# Patient Record
Sex: Female | Born: 1970
Health system: Southern US, Community
[De-identification: ages and names within clinical notes are randomized; demographics above are authoritative.]

## PROBLEM LIST (undated history)

## (undated) DIAGNOSIS — M1990 Unspecified osteoarthritis, unspecified site: Secondary | ICD-10-CM

## (undated) DIAGNOSIS — M2460 Ankylosis, unspecified joint: Secondary | ICD-10-CM

## (undated) DIAGNOSIS — IMO0002 Reserved for concepts with insufficient information to code with codable children: Secondary | ICD-10-CM

## (undated) DIAGNOSIS — I10 Essential (primary) hypertension: Secondary | ICD-10-CM

## (undated) DIAGNOSIS — F32A Depression, unspecified: Secondary | ICD-10-CM

## (undated) DIAGNOSIS — M4120 Other idiopathic scoliosis, site unspecified: Secondary | ICD-10-CM

## (undated) DIAGNOSIS — F329 Major depressive disorder, single episode, unspecified: Secondary | ICD-10-CM

## (undated) HISTORY — PX: KNEE SURGERY: SHX244

## (undated) HISTORY — PX: SHOULDER ARTHROSCOPY: SHX128

---

## 1998-10-25 ENCOUNTER — Emergency Department (HOSPITAL_COMMUNITY): Admission: EM | Admit: 1998-10-25 | Discharge: 1998-10-25 | Payer: Self-pay | Admitting: Emergency Medicine

## 1998-10-25 ENCOUNTER — Encounter: Payer: Self-pay | Admitting: Emergency Medicine

## 1999-05-06 ENCOUNTER — Encounter: Payer: Self-pay | Admitting: Emergency Medicine

## 1999-05-06 ENCOUNTER — Emergency Department (HOSPITAL_COMMUNITY): Admission: EM | Admit: 1999-05-06 | Discharge: 1999-05-06 | Payer: Self-pay | Admitting: Emergency Medicine

## 2000-07-27 ENCOUNTER — Encounter: Payer: Self-pay | Admitting: Obstetrics and Gynecology

## 2000-07-27 ENCOUNTER — Ambulatory Visit (HOSPITAL_COMMUNITY): Admission: RE | Admit: 2000-07-27 | Discharge: 2000-07-27 | Payer: Self-pay | Admitting: Obstetrics and Gynecology

## 2000-07-28 ENCOUNTER — Inpatient Hospital Stay (HOSPITAL_COMMUNITY): Admission: AD | Admit: 2000-07-28 | Discharge: 2000-07-30 | Payer: Self-pay | Admitting: Obstetrics and Gynecology

## 2000-10-12 ENCOUNTER — Ambulatory Visit (HOSPITAL_COMMUNITY): Admission: RE | Admit: 2000-10-12 | Discharge: 2000-10-12 | Payer: Self-pay | Admitting: Internal Medicine

## 2000-10-13 ENCOUNTER — Encounter: Payer: Self-pay | Admitting: Internal Medicine

## 2001-01-08 ENCOUNTER — Emergency Department (HOSPITAL_COMMUNITY): Admission: EM | Admit: 2001-01-08 | Discharge: 2001-01-09 | Payer: Self-pay | Admitting: Emergency Medicine

## 2001-07-07 ENCOUNTER — Emergency Department (HOSPITAL_COMMUNITY): Admission: EM | Admit: 2001-07-07 | Discharge: 2001-07-07 | Payer: Self-pay | Admitting: Emergency Medicine

## 2001-07-15 ENCOUNTER — Encounter: Admission: RE | Admit: 2001-07-15 | Discharge: 2001-07-15 | Payer: Self-pay | Admitting: Internal Medicine

## 2001-07-15 ENCOUNTER — Encounter: Payer: Self-pay | Admitting: Internal Medicine

## 2001-11-02 ENCOUNTER — Other Ambulatory Visit: Admission: RE | Admit: 2001-11-02 | Discharge: 2001-11-02 | Payer: Self-pay | Admitting: Obstetrics and Gynecology

## 2001-11-11 ENCOUNTER — Ambulatory Visit (HOSPITAL_COMMUNITY): Admission: RE | Admit: 2001-11-11 | Discharge: 2001-11-11 | Payer: Self-pay | Admitting: *Deleted

## 2001-11-11 ENCOUNTER — Encounter (INDEPENDENT_AMBULATORY_CARE_PROVIDER_SITE_OTHER): Payer: Self-pay | Admitting: Specialist

## 2002-01-27 ENCOUNTER — Emergency Department (HOSPITAL_COMMUNITY): Admission: EM | Admit: 2002-01-27 | Discharge: 2002-01-27 | Payer: Self-pay | Admitting: Emergency Medicine

## 2002-08-01 ENCOUNTER — Encounter: Payer: Self-pay | Admitting: Internal Medicine

## 2002-08-01 ENCOUNTER — Encounter: Admission: RE | Admit: 2002-08-01 | Discharge: 2002-08-01 | Payer: Self-pay | Admitting: Internal Medicine

## 2003-01-03 ENCOUNTER — Encounter: Admission: RE | Admit: 2003-01-03 | Discharge: 2003-02-14 | Payer: Self-pay | Admitting: Orthopedic Surgery

## 2003-02-10 ENCOUNTER — Encounter: Admission: RE | Admit: 2003-02-10 | Discharge: 2003-02-10 | Payer: Self-pay | Admitting: Orthopedic Surgery

## 2003-07-13 ENCOUNTER — Encounter: Admission: RE | Admit: 2003-07-13 | Discharge: 2003-07-13 | Payer: Self-pay | Admitting: Internal Medicine

## 2003-11-06 ENCOUNTER — Encounter: Admission: RE | Admit: 2003-11-06 | Discharge: 2003-11-06 | Payer: Self-pay | Admitting: Psychiatry

## 2004-02-01 ENCOUNTER — Ambulatory Visit (HOSPITAL_COMMUNITY): Admission: RE | Admit: 2004-02-01 | Discharge: 2004-02-01 | Payer: Self-pay | Admitting: Family Medicine

## 2004-03-18 ENCOUNTER — Encounter: Admission: RE | Admit: 2004-03-18 | Discharge: 2004-05-08 | Payer: Self-pay | Admitting: Specialist

## 2004-06-18 ENCOUNTER — Encounter: Admission: RE | Admit: 2004-06-18 | Discharge: 2004-06-18 | Payer: Self-pay | Admitting: Specialist

## 2005-02-18 ENCOUNTER — Inpatient Hospital Stay (HOSPITAL_COMMUNITY): Admission: AD | Admit: 2005-02-18 | Discharge: 2005-02-20 | Payer: Self-pay | Admitting: Obstetrics and Gynecology

## 2005-04-07 ENCOUNTER — Emergency Department (HOSPITAL_COMMUNITY): Admission: EM | Admit: 2005-04-07 | Discharge: 2005-04-07 | Payer: Self-pay | Admitting: Family Medicine

## 2005-06-02 ENCOUNTER — Emergency Department (HOSPITAL_COMMUNITY): Admission: EM | Admit: 2005-06-02 | Discharge: 2005-06-02 | Payer: Self-pay | Admitting: Family Medicine

## 2005-06-19 ENCOUNTER — Emergency Department (HOSPITAL_COMMUNITY): Admission: EM | Admit: 2005-06-19 | Discharge: 2005-06-19 | Payer: Self-pay | Admitting: Family Medicine

## 2005-07-12 ENCOUNTER — Emergency Department (HOSPITAL_COMMUNITY): Admission: EM | Admit: 2005-07-12 | Discharge: 2005-07-12 | Payer: Self-pay | Admitting: Emergency Medicine

## 2006-01-23 ENCOUNTER — Emergency Department (HOSPITAL_COMMUNITY): Admission: EM | Admit: 2006-01-23 | Discharge: 2006-01-24 | Payer: Self-pay | Admitting: *Deleted

## 2006-04-26 ENCOUNTER — Emergency Department (HOSPITAL_COMMUNITY): Admission: EM | Admit: 2006-04-26 | Discharge: 2006-04-26 | Payer: Self-pay | Admitting: Emergency Medicine

## 2006-05-07 ENCOUNTER — Emergency Department (HOSPITAL_COMMUNITY): Admission: EM | Admit: 2006-05-07 | Discharge: 2006-05-08 | Payer: Self-pay | Admitting: Emergency Medicine

## 2006-06-12 ENCOUNTER — Emergency Department (HOSPITAL_COMMUNITY): Admission: EM | Admit: 2006-06-12 | Discharge: 2006-06-12 | Payer: Self-pay | Admitting: Emergency Medicine

## 2008-06-24 ENCOUNTER — Encounter: Admission: RE | Admit: 2008-06-24 | Discharge: 2008-06-24 | Payer: Self-pay | Admitting: Orthopedic Surgery

## 2008-08-22 ENCOUNTER — Ambulatory Visit (HOSPITAL_COMMUNITY): Admission: RE | Admit: 2008-08-22 | Discharge: 2008-08-22 | Payer: Self-pay | Admitting: Orthopedic Surgery

## 2008-10-07 ENCOUNTER — Emergency Department (HOSPITAL_COMMUNITY): Admission: EM | Admit: 2008-10-07 | Discharge: 2008-10-07 | Payer: Self-pay | Admitting: Emergency Medicine

## 2009-03-12 ENCOUNTER — Observation Stay (HOSPITAL_COMMUNITY): Admission: AD | Admit: 2009-03-12 | Discharge: 2009-03-13 | Payer: Self-pay | Admitting: Orthopedic Surgery

## 2010-04-26 ENCOUNTER — Encounter: Payer: Self-pay | Admitting: Specialist

## 2010-04-27 ENCOUNTER — Encounter: Payer: Self-pay | Admitting: Orthopedic Surgery

## 2010-04-27 ENCOUNTER — Encounter: Payer: Self-pay | Admitting: Specialist

## 2010-04-29 ENCOUNTER — Emergency Department (HOSPITAL_COMMUNITY)
Admission: EM | Admit: 2010-04-29 | Discharge: 2010-04-29 | Payer: Self-pay | Source: Home / Self Care | Admitting: Emergency Medicine

## 2010-07-01 ENCOUNTER — Inpatient Hospital Stay (INDEPENDENT_AMBULATORY_CARE_PROVIDER_SITE_OTHER)
Admission: RE | Admit: 2010-07-01 | Discharge: 2010-07-01 | Disposition: A | Discharge status: Home / Self Care | Payer: 59 | Source: Ambulatory Visit | Attending: Family Medicine | Admitting: Family Medicine

## 2010-07-01 DIAGNOSIS — J020 Streptococcal pharyngitis: Secondary | ICD-10-CM

## 2010-07-08 LAB — URINALYSIS, ROUTINE W REFLEX MICROSCOPIC
Glucose, UA: NEGATIVE mg/dL
Hgb urine dipstick: NEGATIVE
Specific Gravity, Urine: 1.018 (ref 1.005–1.030)

## 2010-07-08 LAB — COMPREHENSIVE METABOLIC PANEL
BUN: 6 mg/dL (ref 6–23)
CO2: 29 mEq/L (ref 19–32)
Chloride: 99 mEq/L (ref 96–112)
Creatinine, Ser: 0.67 mg/dL (ref 0.4–1.2)
GFR calc non Af Amer: >60 mL/min (ref >60)
Total Bilirubin: 0.6 mg/dL (ref 0.3–1.2)

## 2010-07-08 LAB — CBC
HCT: 41.4 % (ref 36.0–46.0)
MCV: 93.9 fL (ref 78.0–100.0)
RBC: 4.41 MIL/uL (ref 3.87–5.11)
WBC: 7.3 10*3/uL (ref 4.0–10.5)

## 2010-07-14 LAB — URINALYSIS, ROUTINE W REFLEX MICROSCOPIC
Ketones, ur: NEGATIVE mg/dL
Nitrite: NEGATIVE
Protein, ur: NEGATIVE mg/dL
Urobilinogen, UA: 0.2 mg/dL (ref 0.0–1.0)
pH: 6.5 (ref 5.0–8.0)

## 2010-07-14 LAB — RAPID URINE DRUG SCREEN, HOSP PERFORMED: Tetrahydrocannabinol: NOT DETECTED

## 2010-07-14 LAB — DIFFERENTIAL
Basophils Absolute: 0.1 10*3/uL (ref 0.0–0.1)
Lymphocytes Relative: 32 % (ref 12–46)
Monocytes Absolute: 0.6 10*3/uL (ref 0.1–1.0)
Monocytes Relative: 6 % (ref 3–12)
Neutro Abs: 5.9 10*3/uL (ref 1.7–7.7)

## 2010-07-14 LAB — POCT I-STAT, CHEM 8
BUN: 12 mg/dL (ref 6–23)
Calcium, Ion: 1.16 mmol/L (ref 1.12–1.32)
Chloride: 103 mEq/L (ref 96–112)
Potassium: 3.6 mEq/L (ref 3.5–5.1)

## 2010-07-14 LAB — CBC
Hemoglobin: 11.8 g/dL — ABNORMAL LOW (ref 12.0–15.0)
RBC: 3.66 MIL/uL — ABNORMAL LOW (ref 3.87–5.11)
RDW: 15.2 % (ref 11.5–15.5)

## 2010-07-14 LAB — POCT CARDIAC MARKERS
CKMB, poc: <1 ng/mL — ABNORMAL LOW (ref 1.0–8.0)
Myoglobin, poc: 32.2 ng/mL (ref 12–200)
Troponin i, poc: <0.05 ng/mL (ref 0.00–0.09)

## 2010-07-14 LAB — PREGNANCY, URINE: Preg Test, Ur: NEGATIVE

## 2010-07-15 LAB — BASIC METABOLIC PANEL
Calcium: 9.4 mg/dL (ref 8.4–10.5)
GFR calc non Af Amer: >60 mL/min (ref >60)
Potassium: 3.5 mEq/L (ref 3.5–5.1)
Sodium: 138 mEq/L (ref 135–145)

## 2010-07-15 LAB — URINALYSIS, ROUTINE W REFLEX MICROSCOPIC
Nitrite: NEGATIVE
Specific Gravity, Urine: 1.023 (ref 1.005–1.030)
Urobilinogen, UA: 0.2 mg/dL (ref 0.0–1.0)
pH: 6 (ref 5.0–8.0)

## 2010-07-15 LAB — CBC
HCT: 39.9 % (ref 36.0–46.0)
Hemoglobin: 13.4 g/dL (ref 12.0–15.0)
RBC: 4.23 MIL/uL (ref 3.87–5.11)
WBC: 8.4 10*3/uL (ref 4.0–10.5)

## 2010-08-19 NOTE — H&P (Signed)
NAMESHONETTE, RHAMES NO.:  [id number]   MEDICAL RECORD NO.:  [id number]          PATIENT TYPE:  EMS   LOCATION:  MAJO                         FACILITY:  [facility]   PHYSICIAN:  Herbie Saxon, MDDATE OF BIRTH:  January 03, 1971   DATE OF ADMISSION:  10/07/2008  DATE OF DISCHARGE:  10/07/2008                              HISTORY & PHYSICAL   PRIMARY CARE PHYSICIAN:  Thayer Headings, MD   CODE STATUS:  Full code.   PRESENTING COMPLAINT:  Chest pain, 2 hours duration.   HISTORY OF PRESENTING COMPLAINT:  This 40 year old African American  female was quite well until 7 p.m. yesterday night when she suddenly  noticed substernal chest pain, 6-8/10 in intensity, squeezing in nature  radiating to the back and to the left arm.  The patient noticed some  shortness of breath.  She denies any diaphoresis or syncopal episode.  She denies any cough or hemoptysis.  The patient has not been  experiencing paroxysmal nocturnal dyspnea, orthopnea, or pedal edema.  The patient smokes less than half a pack a day for more than 18 years.  She used to drink alcohol in college and she has a remote history of  smoking marijuana in college.  The patient does have a significant  family history of coronary artery disease in father, sister, and  brother.  Per the patient, she was diagnosed with a mild myocardial  infarction in 2000 at [facility].  No history of stress test,  echocardiogram, or cardiac cath being performed then.  The patient  denies any history referable to the gastrointestinal, genitourinary,  respiratory, musculoskeletal, or neurological system.   PAST MEDICAL HISTORY:  1. Hyperlipidemia.  2. Depression.  3. Hypertension.  4. Questionable mild MI in 2000.   PAST SURGERIES:  Left knee surgery about 4 months ago.   __________  The patient was recently admitted overnight at [facility] for a bout of depression.  Currently, she does not have any  suicidal ideations.   Please note that this chest pain started 2 hours  after the patient had had an argument with her husband.  She was cooking  where the chest pain started.  The patient claims chest pain is partly  relieved by rest.   PAST SURGICAL HISTORY:  D&C in 2004, the left knee surgery 4 months ago.   FAMILY HISTORY:  Father deceased of coronary artery disease, diabetes.  Brother has heart disease.  He is status post pacemaker.  Sister also  has heart disease.  Grandmother ovarian cancer.   SOCIAL HISTORY:  She is married.  She smokes less than a for pack a day.  She denies any history of illicit drug abuse and currently does not  drink alcohol.   MEDICATIONS AT HOME:  1. Lisinopril 10 mg daily.  2. Naproxen 750 mg daily.  3. Vicodin 10 mg q.6 h. p.r.n.  4. Vitamin B1 one tablet daily.  5. Vitamin C 500 mg daily.  6. Multivitamin 1 tablet daily.  7. Lexapro 10 mg daily.   ALLERGIES:  AMOXICILLIN.   REVIEW OF SYSTEMS:  Fourteen-point review of system was done with the  patient.  Pertinent positives  as stated in the history of presenting  complaint.   PHYSICAL EXAMINATION:  GENERAL:  She is a young lady not in acute  distress.  VITAL SIGNS:  Temperature 98, pulse 75, respiratory rate is 20, blood  pressure 132/81.  HEENT:  Pupils are equal, reactive to light and accommodation.  Head is  atraumatic, normocephalic.  Mucous membranes are moist.  Tympanic  membranes intact.  Oropharynx and nasopharynx are clear.  NECK:  Supple.  No elevated JVD.  No thyromegaly.  No carotid bruit, no  lymphadenopathy.  CHEST:  Clinically clear.  No chest wall tenderness.  HEART:  Heart sounds 1 and 2.  Regular rate and rhythm.  No murmur,  rubs, gallops, or clicks.  ABDOMEN:  Soft, nontender.  Bowel sounds present.  No ventral or  inguinal hernia.  CNS:  No focal neurologic deficits.  EXTREMITIES:  No pedal edema.  No calf tenderness.  No clubbing or  cyanosis.  SKIN:  No rashes.  MUSCULOSKELETAL:   Normal range of movement.   LABORATORY DATA:  WBC 10, hematocrit 34, platelet count 328, troponin  0.05.  Chest x-ray, no acute changes.  Urine toxicology positive for  opiates.  Chemistry, sodium 137, potassium 3.6, chloride 103,  bicarbonate 28, BUN 12, creatinine 0.8, and glucose is 77.   ASSESSMENT:  Chest pain, rule out acute coronary syndrome, mild anemia,  history of depression, history of hyperlipidemia not on medications,  hypertension is stable, family history of coronary artery disease,  tobacco abuse.   PLAN:  The patient is to be admitted for 23-hour observation, telemetry  for initial 23 hours.  We will cycle her cardiac enzymes and EKG q.8 h.  x3.  We will obtain a fasting lipid panel, thyroid function test,  homocysteine coagulation parameters, schedule her for 2-D  echocardiogram.  We will obtain a 2 D-dimer to start and proceed with CT  angiogram if D-dimer elevated.   DIET:  2-g sodium, low cholesterol.   ACTIVITY:  Bedrest.  IV Hep-Lock.  Oxygen 2-4 L via nasal cannula p.r.n.  to keep sats greater than 90, heparin 5000 units subcu q.8 h. for DVT  prophylaxis, Protonix 40 mg IV daily.  We will hold the naproxen, DuoNeb  we are going to dose q.6 h. p.r.n.   We will continue with her home medications.  Decrease her dose of  lisinopril to 5 mg daily.  Start metoprolol 12.5 mg b.i.d., hold if  heart rate is less than 60 or blood pressure less than 160.  Counseled  on tobacco cessation.  We will start on nicotine patch 7 mg per day,  also add Xanax 0.25 mg b.i.d., n.p.o. from midnight for possible stress  test a.m. if cardiac enzymes positive, chest pain persists.  Nitropaste  half-inch q.6 hourly, hold between 6 a.m. and noon daily.   Behavioral health input will be sought.  The patient's illness,  medication test, and treatment plan explained to her, she verbalizes  understanding.  Encounter time 1 hour.      Herbie Saxon, MD  Electronically  Signed     MIO/MEDQ  D:  10/07/2008  T:  10/07/2008  Job:  [id number]   cc:   Lorelle Formosa, M.D.

## 2010-08-19 NOTE — Op Note (Signed)
NAMELEI, DOWER NO.:  [id number]   MEDICAL RECORD NO.:  [id number]          PATIENT TYPE:  AMB   LOCATION:  SDS                          FACILITY:  [facility]   PHYSICIAN:  Myrtie Neither, MD      DATE OF BIRTH:  31-Mar-1971   DATE OF PROCEDURE:  08/22/2008  DATE OF DISCHARGE:                               OPERATIVE REPORT   PREOPERATIVE DIAGNOSIS:  Internal derangement attenuated anterior  cruciate ligament injury left knee.   POSTOPERATIVE DIAGNOSES:  1. Partial tear left anterior cruciate ligament.  2. Synovial cyst.  3. Lateral meniscal tear.   ANESTHESIA:  General.   PROCEDURES:  1. Arthroscopic synovectomy and excision of synovial cyst.  2. Partial lateral meniscectomy left knee.   DESCRIPTION OF PROCEDURE:  The patient was taken to the operating room  after given adequate preop medications given general anesthesia and  intubated.  Right lower extremity was prepped with DuraPrep and draped  in a sterile manner.  Tourniquet using hemostasis.  One half inch  puncture wound was made along the anterior, medial and lateral joint  line.  Inflow was through the medial suprapatellar pouch area.  Inspection of the joint revealed partial disruption of the ACL along  this anterior medial foot with synovial cyst at the base of it and old  fibrotic hemorrhagic areas.  Tension was applied to the ACL which  revealed that there was some attenuation but 90% of the ACL was still  intact and stable.  There was a radial tear of the lateral meniscus.  With the use of basket forceps this was resected back to good healthy  meniscal tissue.  Partial synovectomy also was done both medial and  lateral compartments.  The medial meniscus was well-preserved.  Articular surface was well-preserved.  There was some arthritic changes  involving the lateral compartment of the tibial surface.  No loose  bodies were identified.  Wound closure was then done with 4-0 nylon, 15  mL of  0.25% Marcaine with epinephrine was injected.  Compressive  dressing was applied.  The patient tolerated the procedure quite well  and went to recovery room in stable and satisfactory condition.  The  patient will be discharged home on Percocet 1-2 q. 6 p.r.n., ice packs,  partial weightbearing with use of crutches on the left side and return  to the office in 1 week.  The patient is being discharged in stable and  satisfactory condition.     Myrtie Neither, MD  Electronically Signed     Myrtie Neither, MD  Electronically Signed   AC/MEDQ  D:  08/22/2008  T:  08/22/2008  Job:  [id number]

## 2010-08-22 NOTE — Op Note (Signed)
   NAME:  Kelsey Hines, Kelsey Hines                     ACCOUNT NO.:  [id number]   MEDICAL RECORD NO.:  [id number]                   PATIENT TYPE:  AMB   LOCATION:  SDC                                  [facility]  [facility]   PHYSICIAN:  [place] B. Earlene Plater, M.D.               DATE OF BIRTH:  July 19, 1970   DATE OF PROCEDURE:  DATE OF DISCHARGE:                                 OPERATIVE REPORT   PREOPERATIVE DIAGNOSIS:  A 6 week intrauterine pregnancy desires elective  termination.   POSTOPERATIVE DIAGNOSIS:  A 6 week intrauterine pregnancy desires elective  termination.   PROCEDURE:  Suction and curettage.   ANESTHESIA:  __________ 20 cc 1% lidocaine pericervical block.   SURGEON:  Chester Holstein. Earlene Plater, M.D.   SPECIMENS:  Uterine contents.   FINDINGS:  An 8 week sized inverted uterus.   COMPLICATIONS:  None.   INDICATIONS FOR PROCEDURE:  The patient desires elective termination of  pregnancy.   DESCRIPTION OF PROCEDURE:  The patient was taken to the operating room and  adequate anesthesia was obtained.  She was placed in the ski position, and  prepped and draped in standard fashion.  The bladder was entered into with a  red rubber catheter.  Speculum was inserted, and the anterior lip of the  cervix was grasped with a tooth tenaculum, and pericervical block placed in  standard technique.   The cervix was noted to be dilated already to about a #15.  Dilation was  continued to a #23 without difficulty.   A #7 suction cannula was inserted, suctioned obtained, products returned.  Three to four more patches until no tissues returned.  The uterus was  curetted until a gray texture was noted throughout.  Suction was passed once  more, no further tissue returned.  Therefore, the procedure was terminated.   Instruments were removed.  Her cervix was hemostatic.  The patient was taken  to the recovery room awake and in stable condition.                                                 Gerri Spore B.  Earlene Plater, M.D.    WBD/MEDQ  D:  11/11/2001  T:  11/12/2001  Job:  [id number]

## 2010-08-22 NOTE — H&P (Signed)
   NAME:  Kelsey Hines, ARPS                     ACCOUNT NO.:  [id number]   MEDICAL RECORD NO.:  [id number]                   PATIENT TYPE:  AMB   LOCATION:  SDC                                  [facility]   PHYSICIAN:  [place] B. Earlene Plater, M.D.               DATE OF BIRTH:  1971/01/17   DATE OF ADMISSION:  11/11/2001  DATE OF DISCHARGE:                                HISTORY & PHYSICAL   INTENDED PROCEDURE:  Suction curettage for elective abortion.   HISTORY OF PRESENT ILLNESS:  A 40 year old African-American female gravida  3, para 1, A1, LMP September 18, 2001 desires elective termination.  Ultrasound  shows an intrauterine gestational sac and yolk sac, although fetal pole  could not be identified on November 02, 2001.   PAST MEDICAL HISTORY:  Migraine, history of PID.   PAST SURGICAL HISTORY:  None.   MEDICATIONS:  Nortriptyline.   ALLERGIES:  None.   SOCIAL HISTORY:  One-quarter pack a day smoker.  No alcohol or drugs.   FAMILY HISTORY:  Noncontributory.   REVIEW OF SYMPTOMS:  Otherwise negative.   PHYSICAL EXAMINATION:  VITAL SIGNS:  Blood pressure 104/76, weight 218.  GENERAL:  Alert, oriented, in no acute distress.  SKIN:  Warm with no lesions.  NECK:  Supple.  No thyromegaly.  HEART:  Regular rate and rhythm.  LUNGS:  Clear to auscultation.  ABDOMEN:  Obese.  Liver and spleen normal, no hernia.  LYMPH NODE SURVEY:  Negative with neck, axilla, and groin.  PELVIC:  Normal external genitalia.  Vagina, cervix normal.  Uterus is about  8 week size.  No adnexal masses.  Cervix is closed.  No bleeding.   ASSESSMENT:  Approximately 5-6 week intrauterine pregnancy, desires  termination.  Operative risks discussed including infection, bleeding,  uterine perforation, damage to bowel, bladder, or surrounding organs.  All  questions answered and patient wished to proceed.  Will check blood type on  day of surgery and __________ if negative.            Gerri Spore B. Earlene Plater, M.D.    WBD/MEDQ  D:  11/09/2001  T:  11/10/2001  Job:  [id number]
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
## 2010-11-24 ENCOUNTER — Emergency Department (HOSPITAL_COMMUNITY)
Admission: EM | Admit: 2010-11-24 | Discharge: 2010-11-24 | Discharge status: Against Medical Advice | Payer: Self-pay | Attending: Emergency Medicine | Admitting: Emergency Medicine

## 2010-11-24 DIAGNOSIS — M25569 Pain in unspecified knee: Secondary | ICD-10-CM | POA: Insufficient documentation

## 2010-11-24 DIAGNOSIS — M549 Dorsalgia, unspecified: Secondary | ICD-10-CM | POA: Insufficient documentation

## 2013-10-16 ENCOUNTER — Encounter (HOSPITAL_BASED_OUTPATIENT_CLINIC_OR_DEPARTMENT_OTHER): Payer: Self-pay | Admitting: Emergency Medicine

## 2013-10-16 ENCOUNTER — Emergency Department (HOSPITAL_BASED_OUTPATIENT_CLINIC_OR_DEPARTMENT_OTHER): Payer: BC Managed Care – PPO

## 2013-10-16 ENCOUNTER — Emergency Department (HOSPITAL_BASED_OUTPATIENT_CLINIC_OR_DEPARTMENT_OTHER)
Admission: EM | Admit: 2013-10-16 | Discharge: 2013-10-16 | Disposition: A | Discharge status: Home / Self Care | Payer: BC Managed Care – PPO | Attending: Emergency Medicine | Admitting: Emergency Medicine

## 2013-10-16 DIAGNOSIS — Z79899 Other long term (current) drug therapy: Secondary | ICD-10-CM | POA: Insufficient documentation

## 2013-10-16 DIAGNOSIS — M129 Arthropathy, unspecified: Secondary | ICD-10-CM | POA: Insufficient documentation

## 2013-10-16 DIAGNOSIS — F3289 Other specified depressive episodes: Secondary | ICD-10-CM | POA: Insufficient documentation

## 2013-10-16 DIAGNOSIS — S8010XA Contusion of unspecified lower leg, initial encounter: Secondary | ICD-10-CM | POA: Insufficient documentation

## 2013-10-16 DIAGNOSIS — F329 Major depressive disorder, single episode, unspecified: Secondary | ICD-10-CM | POA: Insufficient documentation

## 2013-10-16 DIAGNOSIS — IMO0002 Reserved for concepts with insufficient information to code with codable children: Secondary | ICD-10-CM | POA: Insufficient documentation

## 2013-10-16 DIAGNOSIS — S40021A Contusion of right upper arm, initial encounter: Secondary | ICD-10-CM

## 2013-10-16 DIAGNOSIS — S40029A Contusion of unspecified upper arm, initial encounter: Secondary | ICD-10-CM | POA: Insufficient documentation

## 2013-10-16 DIAGNOSIS — I10 Essential (primary) hypertension: Secondary | ICD-10-CM | POA: Insufficient documentation

## 2013-10-16 DIAGNOSIS — F17200 Nicotine dependence, unspecified, uncomplicated: Secondary | ICD-10-CM | POA: Insufficient documentation

## 2013-10-16 HISTORY — DX: Ankylosis, unspecified joint: M24.60

## 2013-10-16 HISTORY — DX: Other idiopathic scoliosis, site unspecified: M41.20

## 2013-10-16 HISTORY — DX: Reserved for concepts with insufficient information to code with codable children: IMO0002

## 2013-10-16 HISTORY — DX: Unspecified osteoarthritis, unspecified site: M19.90

## 2013-10-16 HISTORY — DX: Major depressive disorder, single episode, unspecified: F32.9

## 2013-10-16 HISTORY — DX: Essential (primary) hypertension: I10

## 2013-10-16 HISTORY — DX: Depression, unspecified: F32.A

## 2013-10-16 MED ORDER — TETANUS-DIPHTH-ACELL PERTUSSIS 5-2.5-18.5 LF-MCG/0.5 IM SUSP
0.5000 mL | Freq: Once | INTRAMUSCULAR | Status: AC
  Administered 2013-10-16: 0.5 mL via INTRAMUSCULAR

## 2013-10-16 MED ORDER — HYDROCODONE-ACETAMINOPHEN 5-325 MG PO TABS: 1.0000 | ORAL_TABLET | Freq: Four times a day (QID) | ORAL | Status: DC | PRN

## 2013-10-16 MED ORDER — TETANUS-DIPHTH-ACELL PERTUSSIS 5-2.5-18.5 LF-MCG/0.5 IM SUSP
INTRAMUSCULAR | Status: AC
  Filled 2013-10-16: qty 0.5
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
## 2013-10-16 NOTE — ED Notes (Signed)
NP at bedside.
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
## 2013-10-16 NOTE — Discharge Instructions (Signed)
Contusion °A contusion is a deep bruise. Contusions are the result of an injury that caused bleeding under the skin. The contusion may turn blue, purple, or yellow. Minor injuries will give you a painless contusion, but more severe contusions may stay painful and swollen for a few weeks.  °CAUSES  °A contusion is usually caused by a blow, trauma, or direct force to an area of the body. °SYMPTOMS  °· Swelling and redness of the injured area. °· Bruising of the injured area. °· Tenderness and soreness of the injured area. °· Pain. °DIAGNOSIS  °The diagnosis can be made by taking a history and physical exam. An X-ray, CT scan, or MRI may be needed to determine if there were any associated injuries, such as fractures. °TREATMENT  °Specific treatment will depend on what area of the body was injured. In general, the best treatment for a contusion is resting, icing, elevating, and applying cold compresses to the injured area. Over-the-counter medicines may also be recommended for pain control. Ask your caregiver what the best treatment is for your contusion. °HOME CARE INSTRUCTIONS  °· Put ice on the injured area. °· Put ice in a plastic bag. °· Place a towel between your skin and the bag. °· Leave the ice on for 15-20 minutes, 3-4 times a day, or as directed by your health care provider. °· Only take over-the-counter or prescription medicines for pain, discomfort, or fever as directed by your caregiver. Your caregiver may recommend avoiding anti-inflammatory medicines (aspirin, ibuprofen, and naproxen) for 48 hours because these medicines may increase bruising. °· Rest the injured area. °· If possible, elevate the injured area to reduce swelling. °SEEK IMMEDIATE MEDICAL CARE IF:  °· You have increased bruising or swelling. °· You have pain that is getting worse. °· Your swelling or pain is not relieved with medicines. °MAKE SURE YOU:  °· Understand these instructions. °· Will watch your condition. °· Will get help right  away if you are not doing well or get worse. °Document Released: 12/31/2004 Document Revised: 03/28/2013 Document Reviewed: 01/26/2011 °ExitCare® Patient Information ©2015 [employer]. This information is not intended to replace advice given to you by your health care provider. Make sure you discuss any questions you have with your health care provider. ° °Assault, General °Assault includes any behavior, whether intentional or reckless, which results in bodily injury to another person and/or damage to property. Included in this would be any behavior, intentional or reckless, that by its nature would be understood (interpreted) by a reasonable person as intent to harm another person or to damage his/her property. Threats may be oral or written. They may be communicated through regular mail, computer, fax, or phone. These threats may be direct or implied. °FORMS OF ASSAULT INCLUDE: °· Physically assaulting a person. This includes physical threats to inflict physical harm as well as: °¨ Slapping. °¨ Hitting. °¨ Poking. °¨ Kicking. °¨ Punching. °¨ Pushing. °· Arson. °· Sabotage. °· Equipment vandalism. °· Damaging or destroying property. °· Throwing or hitting objects. °· Displaying a weapon or an object that appears to be a weapon in a threatening manner. °¨ Carrying a firearm of any kind. °¨ Using a weapon to harm someone. °· Using greater physical size/strength to intimidate another. °¨ Making intimidating or threatening gestures. °¨ Bullying. °¨ Hazing. °· Intimidating, threatening, hostile, or abusive language directed toward another person. °¨ It communicates the intention to engage in violence against that person. And it leads a reasonable person to expect that violent behavior   may occur. °· Stalking another person. °IF IT HAPPENS AGAIN: °· Immediately call for emergency help (911 in [place] °· If someone poses clear and immediate danger to you, seek legal authorities to have a protective or restraining order  put in place. °· Less threatening assaults can at least be reported to authorities. °STEPS TO TAKE IF A SEXUAL ASSAULT HAS HAPPENED °· Go to an area of safety. This may include a shelter or staying with a friend. Stay away from the area where you have been attacked. A large percentage of sexual assaults are caused by a friend, relative or associate. °· If medications were given by your caregiver, take them as directed for the full length of time prescribed. °· Only take over-the-counter or prescription medicines for pain, discomfort, or fever as directed by your caregiver. °· If you have come in contact with a sexual disease, find out if you are to be tested again. If your caregiver is concerned about the HIV/AIDS virus, he/she may require you to have continued testing for several months. °· For the protection of your privacy, test results can not be given over the phone. Make sure you receive the results of your test. If your test results are not back during your visit, make an appointment with your caregiver to find out the results. Do not assume everything is normal if you have not heard from your caregiver or the medical facility. It is important for you to follow up on all of your test results. °· File appropriate papers with authorities. This is important in all assaults, even if it has occurred in a family or by a friend. °SEEK MEDICAL CARE IF: °· You have new problems because of your injuries. °· You have problems that may be because of the medicine you are taking, such as: °¨ Rash. °¨ Itching. °¨ Swelling. °¨ Trouble breathing. °· You develop belly (abdominal) pain, feel sick to your stomach (nausea) or are vomiting. °· You begin to run a temperature. °· You need supportive care or referral to a rape crisis center. These are centers with trained personnel who can help you get through this ordeal. °SEEK IMMEDIATE MEDICAL CARE IF: °· You are afraid of being threatened, beaten, or abused. In [place] call  911. °· You receive new injuries related to abuse. °· You develop severe pain in any area injured in the assault or have any change in your condition that concerns you. °· You faint or lose consciousness. °· You develop chest pain or shortness of breath. °Document Released: 03/23/2005 Document Revised: 06/15/2011 Document Reviewed: 11/09/2007 °ExitCare® Patient Information ©2015 [employer]. This information is not intended to replace advice given to you by your health care provider. Make sure you discuss any questions you have with your health care provider. ° °

## 2014-05-28 ENCOUNTER — Encounter (HOSPITAL_COMMUNITY): Payer: Self-pay | Admitting: Emergency Medicine

## 2014-05-28 ENCOUNTER — Emergency Department (INDEPENDENT_AMBULATORY_CARE_PROVIDER_SITE_OTHER)
Admission: EM | Admit: 2014-05-28 | Discharge: 2014-05-28 | Disposition: A | Discharge status: Home / Self Care | Payer: BLUE CROSS/BLUE SHIELD | Source: Home / Self Care | Attending: Family Medicine | Admitting: Family Medicine

## 2014-05-28 DIAGNOSIS — G43909 Migraine, unspecified, not intractable, without status migrainosus: Secondary | ICD-10-CM

## 2014-05-28 DIAGNOSIS — G44209 Tension-type headache, unspecified, not intractable: Secondary | ICD-10-CM

## 2014-05-28 MED ORDER — KETOROLAC TROMETHAMINE 60 MG/2ML IM SOLN
INTRAMUSCULAR | Status: AC
  Filled 2014-05-28: qty 2

## 2014-05-28 MED ORDER — ONDANSETRON 4 MG PO TBDP
ORAL_TABLET | ORAL | Status: AC
  Filled 2014-05-28: qty 1

## 2014-05-28 MED ORDER — DEXAMETHASONE SODIUM PHOSPHATE 10 MG/ML IJ SOLN
INTRAMUSCULAR | Status: AC
  Filled 2014-05-28: qty 1

## 2014-05-28 MED ORDER — ONDANSETRON 4 MG PO TBDP
4.0000 mg | ORAL_TABLET | Freq: Once | ORAL | Status: AC
  Administered 2014-05-28: 4 mg via ORAL

## 2014-05-28 MED ORDER — KETOROLAC TROMETHAMINE 60 MG/2ML IM SOLN
60.0000 mg | Freq: Once | INTRAMUSCULAR | Status: AC
  Administered 2014-05-28: 60 mg via INTRAMUSCULAR

## 2014-05-28 MED ORDER — DEXAMETHASONE SODIUM PHOSPHATE 10 MG/ML IJ SOLN
10.0000 mg | Freq: Once | INTRAMUSCULAR | Status: AC
  Administered 2014-05-28: 10 mg via INTRAMUSCULAR

## 2014-05-28 NOTE — ED Provider Notes (Signed)
CSN: [id number]     Arrival date & time 05/28/14  1211 History   First MD Initiated Contact with Patient 05/28/14 1443     Chief Complaint  Patient presents with  . Headache   (Consider location/radiation/quality/duration/timing/severity/associated sxs/prior Treatment) HPI Comments: 44 year old female complaining of a migraine headaches chest today. She states she has a history migraine headaches and her last one was over 6-12 months ago. She believes this headache is similar to the last headache but does not remember all the details of her last headache. She states that her pain started across the fore head and greatest over the right. She states it was initially throbbing. She now just has a milder goal ache over the fore head. Other area pain includes the bilateral paracervical musculature. She states she feels tense in the muscles her shoulders and the back of the neck. Associated symptoms include mild nausea without vomiting, so no photophobia and "a little blurred vision". She denies problems with speech, hearing, swallowing, diplopia, focal weakness or paresthesia or incontinence. She is ambulatory and drove herself to the urgent care. She took a Goody's powder for her headache without relief.   Past Medical History  Diagnosis Date  . Scoliosis (and kyphoscoliosis), idiopathic   . Herniated disc   . Ankylosis   . Arthritis   . Hypertension   . Depression    Past Surgical History  Procedure Laterality Date  . Shoulder arthroscopy     No family history on file. History  Substance Use Topics  . Smoking status: Current Every Day Smoker -- 0.50 packs/day    Types: Cigarettes  . Smokeless tobacco: Not on file  . Alcohol Use: No   OB History    No data available     Review of Systems  Constitutional: Positive for activity change and appetite change. Negative for fever.  Eyes: Positive for visual disturbance.  Respiratory: Negative.   Cardiovascular: Negative.    Gastrointestinal: Positive for nausea. Negative for vomiting, abdominal pain and constipation.  Genitourinary: Negative.   Musculoskeletal: Negative.   Skin: Negative.   Neurological: Positive for headaches. Negative for dizziness, tremors, seizures, syncope, facial asymmetry, speech difficulty, weakness, light-headedness and numbness.  Psychiatric/Behavioral: Negative.     Allergies  Avelox  Home Medications   Prior to Admission medications   Medication Sig Start Date End Date Taking? Authorizing Provider  DULoxetine (CYMBALTA) 30 MG capsule Take 30 mg by mouth daily.    Historical Provider, MD  fentaNYL (DURAGESIC - DOSED MCG/HR) 50 MCG/HR Place 50 mcg onto the skin every 3 (three) days.    Historical Provider, MD  HYDROcodone-acetaminophen (NORCO) 10-325 MG per tablet Take 1 tablet by mouth every 6 (six) hours as needed.    Historical Provider, MD  HYDROcodone-acetaminophen (NORCO) 5-325 MG per tablet Take 1 tablet by mouth every 6 (six) hours as needed. 10/16/13   Toy Cookey, MD   BP 140/82 mmHg  Pulse 68  Temp(Src) 99.7 F (37.6 C) (Oral)  Resp 18  SpO2 100% Physical Exam  Constitutional: She is oriented to person, place, and time. She appears well-developed and well-nourished. No distress.  HENT:  Head: Normocephalic and atraumatic.  Mouth/Throat: Oropharynx is clear and moist. No oropharyngeal exudate.  Uvula midline. Soft palate rises symmetrically. No intraoral lesions observed. Face is symmetric expressions are symmetric.  Eyes: Conjunctivae and EOM are normal. Pupils are equal, round, and reactive to light. Right eye exhibits no discharge. Left eye exhibits no discharge.  Somewhat difficult to examine  pupils due to photophobia. However they are PERRLA  Neck: Normal range of motion. Neck supple.  Bilateral paracervical muscle tenderness.  Cardiovascular: Normal rate, regular rhythm, normal heart sounds and intact distal pulses.   No murmur heard. Pulmonary/Chest:  Effort normal and breath sounds normal. No respiratory distress. She has no wheezes. She has no rales.  Musculoskeletal: Normal range of motion. She exhibits no edema or tenderness.  Lymphadenopathy:    She has no cervical adenopathy.  Neurological: She is alert and oriented to person, place, and time. She has normal strength. No cranial nerve deficit or sensory deficit. She exhibits normal muscle tone. She displays a negative Romberg sign. Coordination and gait normal. GCS eye subscore is 4. GCS verbal subscore is 5. GCS motor subscore is 6.  Skin: Skin is warm and dry. No rash noted. She is not diaphoretic.  Psychiatric: She has a normal mood and affect. Her behavior is normal. Thought content normal.  Nursing note and vitals reviewed.   ED Course  Procedures (including critical care time) Labs Review Labs Reviewed - No data to display  Imaging Review No results found.   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 Follow-up with PCP. For worsening, new symptoms or problems go to the emergency department.   ]   Hayden Rasmussenavid Irmgard Rampersaud, NP 05/28/14 1539

## 2014-05-28 NOTE — ED Notes (Signed)
C/o constant HA onset yest night  Pain increases w/bright lights and loud noises Sx also include: nauseas and blurry vision Alert, no signs of acute distress.

## 2014-05-28 NOTE — Discharge Instructions (Signed)
Migraine Headache A migraine headache is an intense, throbbing pain on one or both sides of your head. A migraine can last for 30 minutes to several hours. CAUSES  The exact cause of a migraine headache is not always known. However, a migraine may be caused when nerves in the brain become irritated and release chemicals that cause inflammation. This causes pain. Certain things may also trigger migraines, such as:  Alcohol.  Smoking.  Stress.  Menstruation.  Aged cheeses.  Foods or drinks that contain nitrates, glutamate, aspartame, or tyramine.  Lack of sleep.  Chocolate.  Caffeine.  Hunger.  Physical exertion.  Fatigue.  Medicines used to treat chest pain (nitroglycerine), birth control pills, estrogen, and some blood pressure medicines. SIGNS AND SYMPTOMS  Pain on one or both sides of your head.  Pulsating or throbbing pain.  Severe pain that prevents daily activities.  Pain that is aggravated by any physical activity.  Nausea, vomiting, or both.  Dizziness.  Pain with exposure to bright lights, loud noises, or activity.  General sensitivity to bright lights, loud noises, or smells. Before you get a migraine, you may get warning signs that a migraine is coming (aura). An aura may include:  Seeing flashing lights.  Seeing bright spots, halos, or zigzag lines.  Having tunnel vision or blurred vision.  Having feelings of numbness or tingling.  Having trouble talking.  Having muscle weakness. DIAGNOSIS  A migraine headache is often diagnosed based on:  Symptoms.  Physical exam.  A CT scan or MRI of your head. These imaging tests cannot diagnose migraines, but they can help rule out other causes of headaches. TREATMENT Medicines may be given for pain and nausea. Medicines can also be given to help prevent recurrent migraines.  HOME CARE INSTRUCTIONS  Only take over-the-counter or prescription medicines for pain or discomfort as directed by your  health care provider. The use of long-term narcotics is not recommended.  Lie down in a dark, quiet room when you have a migraine.  Keep a journal to find out what may trigger your migraine headaches. For example, write down:  What you eat and drink.  How much sleep you get.  Any change to your diet or medicines.  Limit alcohol consumption.  Quit smoking if you smoke.  Get 7-9 hours of sleep, or as recommended by your health care provider.  Limit stress.  Keep lights dim if bright lights bother you and make your migraines worse. SEEK IMMEDIATE MEDICAL CARE IF:   Your migraine becomes severe.  You have a fever.  You have a stiff neck.  You have vision loss.  You have muscular weakness or loss of muscle control.  You start losing your balance or have trouble walking.  You feel faint or pass out.  You have severe symptoms that are different from your first symptoms. MAKE SURE YOU:   Understand these instructions.  Will watch your condition.  Will get help right away if you are not doing well or get worse. Document Released: 03/23/2005 Document Revised: 08/07/2013 Document Reviewed: 11/28/2012 [facility] Patient Information 2015 [place], [place]. This information is not intended to replace advice given to you by your health care provider. Make sure you discuss any questions you have with your health care provider.  Tension Headache A tension headache is a feeling of pain, pressure, or aching often felt over the front and sides of the head. The pain can be dull or can feel tight (constricting). It is the most common type  of headache. Tension headaches are not normally associated with nausea or vomiting and do not get worse with physical activity. Tension headaches can last 30 minutes to several days.  CAUSES  The exact cause is not known, but it may be caused by chemicals and hormones in the brain that lead to pain. Tension headaches often begin after stress, anxiety, or  depression. Other triggers may include:  Alcohol.  Caffeine (too much or withdrawal).  Respiratory infections (colds, flu, sinus infections).  Dental problems or teeth clenching.  Fatigue.  Holding your head and neck in one position too long while using a computer. SYMPTOMS   Pressure around the head.   Dull, aching head pain.   Pain felt over the front and sides of the head.   Tenderness in the muscles of the head, neck, and shoulders. DIAGNOSIS  A tension headache is often diagnosed based on:   Symptoms.   Physical examination.   A CT scan or MRI of your head. These tests may be ordered if symptoms are severe or unusual. TREATMENT  Medicines may be given to help relieve symptoms.  HOME CARE INSTRUCTIONS   Only take over-the-counter or prescription medicines for pain or discomfort as directed by your caregiver.   Lie down in a dark, quiet room when you have a headache.   Keep a journal to find out what may be triggering your headaches. For example, write down:  What you eat and drink.  How much sleep you get.  Any change to your diet or medicines.  Try massage or other relaxation techniques.   Ice packs or heat applied to the head and neck can be used. Use these 3 to 4 times per day for 15 to 20 minutes each time, or as needed.   Limit stress.   Sit up straight, and do not tense your muscles.   Quit smoking if you smoke.  Limit alcohol use.  Decrease the amount of caffeine you drink, or stop drinking caffeine.  Eat and exercise regularly.  Get 7 to 9 hours of sleep, or as recommended by your caregiver.  Avoid excessive use of pain medicine as recurrent headaches can occur.  SEEK MEDICAL CARE IF:   You have problems with the medicines you were prescribed.  Your medicines do not work.  You have a change from the usual headache.  You have nausea or vomiting. SEEK IMMEDIATE MEDICAL CARE IF:   Your headache becomes severe.  You  have a fever.  You have a stiff neck.  You have loss of vision.  You have muscular weakness or loss of muscle control.  You lose your balance or have trouble walking.  You feel faint or pass out.  You have severe symptoms that are different from your first symptoms. MAKE SURE YOU:   Understand these instructions.  Will watch your condition.  Will get help right away if you are not doing well or get worse. Document Released: 03/23/2005 Document Revised: 06/15/2011 Document Reviewed: 03/13/2011 [facility] Patient Information 2015 [place], [place]. This information is not intended to replace advice given to you by your health care provider. Make sure you discuss any questions you have with your health care provider.

## 2014-10-23 ENCOUNTER — Encounter (HOSPITAL_COMMUNITY): Payer: Self-pay | Admitting: *Deleted

## 2014-10-23 ENCOUNTER — Emergency Department (HOSPITAL_COMMUNITY)
Admission: EM | Admit: 2014-10-23 | Discharge: 2014-10-24 | Disposition: A | Discharge status: Home / Self Care | Payer: BLUE CROSS/BLUE SHIELD | Attending: Emergency Medicine | Admitting: Emergency Medicine

## 2014-10-23 DIAGNOSIS — Z720 Tobacco use: Secondary | ICD-10-CM | POA: Insufficient documentation

## 2014-10-23 DIAGNOSIS — R42 Dizziness and giddiness: Secondary | ICD-10-CM | POA: Insufficient documentation

## 2014-10-23 DIAGNOSIS — R3919 Other difficulties with micturition: Secondary | ICD-10-CM | POA: Insufficient documentation

## 2014-10-23 DIAGNOSIS — R197 Diarrhea, unspecified: Secondary | ICD-10-CM | POA: Insufficient documentation

## 2014-10-23 DIAGNOSIS — M1990 Unspecified osteoarthritis, unspecified site: Secondary | ICD-10-CM | POA: Insufficient documentation

## 2014-10-23 DIAGNOSIS — M791 Myalgia, unspecified site: Secondary | ICD-10-CM

## 2014-10-23 DIAGNOSIS — R109 Unspecified abdominal pain: Secondary | ICD-10-CM | POA: Insufficient documentation

## 2014-10-23 DIAGNOSIS — R61 Generalized hyperhidrosis: Secondary | ICD-10-CM | POA: Insufficient documentation

## 2014-10-23 DIAGNOSIS — I10 Essential (primary) hypertension: Secondary | ICD-10-CM | POA: Insufficient documentation

## 2014-10-23 LAB — URINALYSIS, ROUTINE W REFLEX MICROSCOPIC
BILIRUBIN URINE: NEGATIVE
Glucose, UA: NEGATIVE mg/dL
HGB URINE DIPSTICK: NEGATIVE
Ketones, ur: NEGATIVE mg/dL
LEUKOCYTES UA: NEGATIVE
Nitrite: NEGATIVE
PH: 5.5 (ref 5.0–8.0)
PROTEIN: NEGATIVE mg/dL
SPECIFIC GRAVITY, URINE: 1.036 — AB (ref 1.005–1.030)
UROBILINOGEN UA: 0.2 mg/dL (ref 0.0–1.0)

## 2014-10-23 LAB — CBC
HCT: 38.8 % (ref 36.0–46.0)
Hemoglobin: 13.1 g/dL (ref 12.0–15.0)
MCH: 29.4 pg (ref 26.0–34.0)
MCHC: 33.8 g/dL (ref 30.0–36.0)
MCV: 87.2 fL (ref 78.0–100.0)
Platelets: 306 10*3/uL (ref 150–400)
RBC: 4.45 MIL/uL (ref 3.87–5.11)
RDW: 14.4 % (ref 11.5–15.5)
WBC: 11.2 10*3/uL — AB (ref 4.0–10.5)

## 2014-10-23 LAB — BASIC METABOLIC PANEL
ANION GAP: 8 (ref 5–15)
BUN: 15 mg/dL (ref 6–20)
CALCIUM: 9.1 mg/dL (ref 8.9–10.3)
CHLORIDE: 104 mmol/L (ref 101–111)
CO2: 26 mmol/L (ref 22–32)
CREATININE: 1.01 mg/dL — AB (ref 0.44–1.00)
GLUCOSE: 125 mg/dL — AB (ref 65–99)
Potassium: 3.4 mmol/L — ABNORMAL LOW (ref 3.5–5.1)
Sodium: 138 mmol/L (ref 135–145)

## 2014-10-23 NOTE — ED Notes (Signed)
Pt states that she started feeling bad Wednesday last wek. States that she has been experiencing dizziness, dark urine, diarrhea, generalized pain. States that she has fibromyalgia but this feels different.

## 2014-10-23 NOTE — ED Provider Notes (Signed)
CSN: [id number]     Arrival date & time 10/23/14  1956 History   This chart was scribed for Linton Flemings, MD by Chester Holstein, ED Scribe. This patient was seen in room A07C/A07C and the patient's care was started at 11:55 PM.      Chief Complaint  Patient presents with  . Dizziness  . Diarrhea  . Abdominal Pain     The history is provided by the patient. No language interpreter was used.   HPI Comments: Daryle Boyington is a 44 y.o. female with PMHx of scoliosis, herniated disc, ankylosis, arthritis, fibromyalgia, and HTN who presents to the Emergency Department complaining of epigastric abdominal pain with onset yesterday. Pt notes associated diaphoresis, lightheadedness, dizziness, diarrhea approximately 5 episodes, dark brown urine, and difficulty urinating with onset yesterday. She reports generalized "paralyzing" body aches radiating from neck down to feet with onset around 1 PM. She states lying still alleviates the pain and movement worsens the pain. She reports currently 7/10 pain with 10/10 pain with movement. Pt is going through menopause and states her hot flashes have worsened in last week. Pt took Celebrex, 6 ibuprofen, and hydrocodone with no relief. She has on a Fentanyl patch. She states she needed assistance doing regular tasks. She denies h/o of similar pain. She denies recent tick bites, insect bites, and sick contacts. She denies fever, chills, hematochezia, and vomiting.  Past Medical History  Diagnosis Date  . Scoliosis (and kyphoscoliosis), idiopathic   . Herniated disc   . Ankylosis   . Arthritis   . Hypertension   . Depression    Past Surgical History  Procedure Laterality Date  . Shoulder arthroscopy     No family history on file. History  Substance Use Topics  . Smoking status: Current Every Day Smoker -- 0.50 packs/day    Types: Cigarettes  . Smokeless tobacco: Not on file  . Alcohol Use: No   OB History    No data available     Review of Systems   Constitutional: Positive for diaphoresis. Negative for fever, chills and appetite change.  Gastrointestinal: Positive for abdominal pain and diarrhea. Negative for vomiting and blood in stool.  Genitourinary: Positive for difficulty urinating.  Musculoskeletal: Positive for myalgias.  Neurological: Positive for light-headedness.      Allergies  Avelox  Home Medications   Prior to Admission medications   Medication Sig Start Date End Date Taking? Authorizing Provider  DULoxetine (CYMBALTA) 30 MG capsule Take 30 mg by mouth daily.    Historical Provider, MD  fentaNYL (DURAGESIC - DOSED MCG/HR) 50 MCG/HR Place 50 mcg onto the skin every 3 (three) days.    Historical Provider, MD  HYDROcodone-acetaminophen (NORCO) 10-325 MG per tablet Take 1 tablet by mouth every 6 (six) hours as needed.    Historical Provider, MD  HYDROcodone-acetaminophen (NORCO) 5-325 MG per tablet Take 1 tablet by mouth every 6 (six) hours as needed. 10/16/13   Ernestina Patches, MD   BP 141/81 mmHg  Pulse 91  Temp(Src) 98.9 F (37.2 C) (Oral)  Resp 13  SpO2 100% Physical Exam  Constitutional: She is oriented to person, place, and time. She appears well-developed and well-nourished.  HENT:  Head: Normocephalic and atraumatic.  Nose: Nose normal.  Mouth/Throat: Oropharynx is clear and moist.  Eyes: Conjunctivae and EOM are normal. Pupils are equal, round, and reactive to light.  Neck: Normal range of motion. Neck supple. No JVD present. No tracheal deviation present. No thyromegaly present.  Cardiovascular: Normal rate,  regular rhythm, normal heart sounds and intact distal pulses.  Exam reveals no gallop and no friction rub.   No murmur heard. Pulmonary/Chest: Effort normal and breath sounds normal. No stridor. No respiratory distress. She has no wheezes. She has no rales. She exhibits no tenderness.  Abdominal: Soft. Bowel sounds are normal. She exhibits no distension and no mass. There is no tenderness. There is  no rebound and no guarding.  Musculoskeletal: Normal range of motion. She exhibits tenderness (diffuse tenderness with ROM of extremites and palpation of trigger points). She exhibits no edema.  Lymphadenopathy:    She has no cervical adenopathy.  Neurological: She is alert and oriented to person, place, and time. She displays normal reflexes. She exhibits normal muscle tone. Coordination normal.  Skin: Skin is warm and dry. No rash noted. No erythema. No pallor.  Psychiatric: She has a normal mood and affect. Her behavior is normal. Judgment and thought content normal.  Nursing note and vitals reviewed.   ED Course  Procedures (including critical care time) DIAGNOSTIC STUDIES: Oxygen Saturation is 100% on room air, normal by my interpretation.    COORDINATION OF CARE: 12:04 AM Discussed treatment plan with patient at beside, the patient agrees with the plan and has no further questions at this time.   Labs Review Labs Reviewed  BASIC METABOLIC PANEL - Abnormal; Notable for the following:    Potassium 3.4 (*)    Glucose, Bld 125 (*)    Creatinine, Ser 1.01 (*)    All other components within normal limits  CBC - Abnormal; Notable for the following:    WBC 11.2 (*)    All other components within normal limits  URINALYSIS, ROUTINE W REFLEX MICROSCOPIC (NOT AT [facility]) - Abnormal; Notable for the following:    Color, Urine AMBER (*)    Specific Gravity, Urine 1.036 (*)    All other components within normal limits  HEPATIC FUNCTION PANEL - Abnormal; Notable for the following:    Bilirubin, Direct <0.1 (*)    All other components within normal limits  LIPASE, BLOOD - Abnormal; Notable for the following:    Lipase 16 (*)    All other components within normal limits  SEDIMENTATION RATE - Abnormal; Notable for the following:    Sed Rate 34 (*)    All other components within normal limits  CK  I-STAT CG4 LACTIC ACID, ED    Imaging Review No results found.   EKG  Interpretation None      MDM   Final diagnoses:  Myalgia  Diarrhea    I personally performed the services described in this documentation, which was scribed in my presence. The recorded information has been reviewed and is accurate.  44 year old female with history of fibromyalgia on fentanyl, hydrocodone, Celebrex, with worsening myalgias and several episodes of loose stool yesterday with dark urine.  Workup thus far unremarkable.  She is diffusely tender to palpation.  Patient speaks in full sentences, does not appear acutely ill.  Plan to add on hepatic function panel, ESR, CK.  Patient is stable for referral back to her primary care doctor.   Linton Flemings, MD 10/24/14 [phone]

## 2014-10-24 LAB — HEPATIC FUNCTION PANEL
ALT: 23 U/L (ref 14–54)
AST: 22 U/L (ref 15–41)
Albumin: 4 g/dL (ref 3.5–5.0)
Alkaline Phosphatase: 48 U/L (ref 38–126)
Bilirubin, Direct: <0.1 mg/dL — ABNORMAL LOW (ref 0.1–0.5)
Total Bilirubin: 0.7 mg/dL (ref 0.3–1.2)
Total Protein: 7.1 g/dL (ref 6.5–8.1)

## 2014-10-24 LAB — I-STAT CG4 LACTIC ACID, ED: LACTIC ACID, VENOUS: 1.86 mmol/L (ref 0.5–2.0)

## 2014-10-24 LAB — CK: Total CK: 61 U/L (ref 38–234)

## 2014-10-24 LAB — SEDIMENTATION RATE: Sed Rate: 34 mm/hr — ABNORMAL HIGH (ref 0–22)

## 2014-10-24 LAB — LIPASE, BLOOD: Lipase: 16 U/L — ABNORMAL LOW (ref 22–51)

## 2014-10-24 MED ORDER — METHOCARBAMOL 500 MG PO TABS
1000.0000 mg | ORAL_TABLET | Freq: Once | ORAL | Status: AC
  Administered 2014-10-24: 1000 mg via ORAL
  Filled 2014-10-24: qty 2

## 2014-10-24 MED ORDER — METHOCARBAMOL 500 MG PO TABS: 500.0000 mg | ORAL_TABLET | Freq: Three times a day (TID) | ORAL | Status: DC | PRN

## 2014-10-24 NOTE — ED Notes (Signed)
Pt given cup of water; pt's young child given meal bag

## 2014-10-24 NOTE — ED Notes (Signed)
MD at bedside. 

## 2014-10-24 NOTE — Discharge Instructions (Signed)
Your workup today has not shown a specific cause for your symptoms.  Drink plenty of water.  Take no more than 4 ibuprofen at a time, no more than 3 times a day.  You have been given a prescription of Robaxin to help with muscle spasms.  Contact your doctor for follow-up in 2-3 days for recheck.  Stay well hydrated by drinking plenty of fluids.  Stick to a bland diet until feeling better   Fibromyalgia Fibromyalgia is a disorder that is often misunderstood. It is associated with muscular pains and tenderness that comes and goes. It is often associated with fatigue and sleep disturbances. Though it tends to be long-lasting, fibromyalgia is not life-threatening. CAUSES  The exact cause of fibromyalgia is unknown. People with certain gene types are predisposed to developing fibromyalgia and other conditions. Certain factors can play a role as triggers, such as:  Spine disorders.  Arthritis.  Severe injury (trauma) and other physical stressors.  Emotional stressors. SYMPTOMS   The main symptom is pain and stiffness in the muscles and joints, which can vary over time.  Sleep and fatigue problems. Other related symptoms may include:  Bowel and bladder problems.  Headaches.  Visual problems.  Problems with odors and noises.  Depression or mood changes.  Painful periods (dysmenorrhea).  Dryness of the skin or eyes. DIAGNOSIS  There are no specific tests for diagnosing fibromyalgia. Patients can be diagnosed accurately from the specific symptoms they have. The diagnosis is made by determining that nothing else is causing the problems. TREATMENT  There is no cure. Management includes medicines and an active, healthy lifestyle. The goal is to enhance physical fitness, decrease pain, and improve sleep. HOME CARE INSTRUCTIONS   Only take over-the-counter or prescription medicines as directed by your caregiver. Sleeping pills, tranquilizers, and pain medicines may make your problems  worse.  Low-impact aerobic exercise is very important and advised for treatment. At first, it may seem to make pain worse. Gradually increasing your tolerance will overcome this feeling.  Learning relaxation techniques and how to control stress will help you. Biofeedback, visual imagery, hypnosis, muscle relaxation, yoga, and meditation are all options.  Anti-inflammatory medicines and physical therapy may provide short-term help.  Acupuncture or massage treatments may help.  Take muscle relaxant medicines as suggested by your caregiver.  Avoid stressful situations.  Plan a healthy lifestyle. This includes your diet, sleep, rest, exercise, and friends.  Find and practice a hobby you enjoy.  Join a fibromyalgia support group for interaction, ideas, and sharing advice. This may be helpful. SEEK MEDICAL CARE IF:  You are not having good results or improvement from your treatment. FOR MORE INFORMATION  National Fibromyalgia Association: www.fmaware.org Arthritis Foundation: www.arthritis.org Document Released: 03/23/2005 Document Revised: 06/15/2011 Document Reviewed: 07/03/2009 [facility] Patient Information 2015 [place], [place]. This information is not intended to replace advice given to you by your health care provider. Make sure you discuss any questions you have with your health care provider.  Diarrhea Diarrhea is frequent loose and watery bowel movements. It can cause you to feel weak and dehydrated. Dehydration can cause you to become tired and thirsty, have a dry mouth, and have decreased urination that often is dark yellow. Diarrhea is a sign of another problem, most often an infection that will not last long. In most cases, diarrhea typically lasts 2-3 days. However, it can last longer if it is a sign of something more serious. It is important to treat your diarrhea as directed by your caregiver  to lessen or prevent future episodes of diarrhea. CAUSES  Some common causes  include:  Gastrointestinal infections caused by viruses, bacteria, or parasites.  Food poisoning or food allergies.  Certain medicines, such as antibiotics, chemotherapy, and laxatives.  Artificial sweeteners and fructose.  Digestive disorders. HOME CARE INSTRUCTIONS  Ensure adequate fluid intake (hydration): Have 1 cup (8 oz) of fluid for each diarrhea episode. Avoid fluids that contain simple sugars or sports drinks, fruit juices, whole milk products, and sodas. Your urine should be clear or pale yellow if you are drinking enough fluids. Hydrate with an oral rehydration solution that you can purchase at pharmacies, retail stores, and online. You can prepare an oral rehydration solution at home by mixing the following ingredients together:   - tsp table salt.   tsp baking soda.   tsp salt substitute containing potassium chloride.  1  tablespoons sugar.  1 L (34 oz) of water.  Certain foods and beverages may increase the speed at which food moves through the gastrointestinal (GI) tract. These foods and beverages should be avoided and include:  Caffeinated and alcoholic beverages.  High-fiber foods, such as raw fruits and vegetables, nuts, seeds, and whole grain breads and cereals.  Foods and beverages sweetened with sugar alcohols, such as xylitol, sorbitol, and mannitol.  Some foods may be well tolerated and may help thicken stool including:  Starchy foods, such as rice, toast, pasta, low-sugar cereal, oatmeal, grits, baked potatoes, crackers, and bagels.  Bananas.  Applesauce.  Add probiotic-rich foods to help increase healthy bacteria in the GI tract, such as yogurt and fermented milk products.  Wash your hands well after each diarrhea episode.  Only take over-the-counter or prescription medicines as directed by your caregiver.  Take a warm bath to relieve any burning or pain from frequent diarrhea episodes. SEEK IMMEDIATE MEDICAL CARE IF:   You are unable to  keep fluids down.  You have persistent vomiting.  You have blood in your stool, or your stools are black and tarry.  You do not urinate in 6-8 hours, or there is only a small amount of very dark urine.  You have abdominal pain that increases or localizes.  You have weakness, dizziness, confusion, or light-headedness.  You have a severe headache.  Your diarrhea gets worse or does not get better.  You have a fever or persistent symptoms for more than 2-3 days.  You have a fever and your symptoms suddenly get worse. MAKE SURE YOU:   Understand these instructions.  Will watch your condition.  Will get help right away if you are not doing well or get worse. Document Released: 03/13/2002 Document Revised: 08/07/2013 Document Reviewed: 11/29/2011 [facility] Patient Information 2015 [place], [place]. This information is not intended to replace advice given to you by your health care provider. Make sure you discuss any questions you have with your health care provider.  Food Choices to Help Relieve Diarrhea When you have diarrhea, the foods you eat and your eating habits are very important. Choosing the right foods and drinks can help relieve diarrhea. Also, because diarrhea can last up to 7 days, you need to replace lost fluids and electrolytes (such as sodium, potassium, and chloride) in order to help prevent dehydration.  WHAT GENERAL GUIDELINES DO I NEED TO FOLLOW?  Slowly drink 1 cup (8 oz) of fluid for each episode of diarrhea. If you are getting enough fluid, your urine will be clear or pale yellow.  Eat starchy foods. Some good choices include  white rice, white toast, pasta, low-fiber cereal, baked potatoes (without the skin), saltine crackers, and bagels.  Avoid large servings of any cooked vegetables.  Limit fruit to two servings per day. A serving is  cup or 1 small piece.  Choose foods with less than 2 g of fiber per serving.  Limit fats to less than 8 tsp (38 g) per  day.  Avoid fried foods.  Eat foods that have probiotics in them. Probiotics can be found in certain dairy products.  Avoid foods and beverages that may increase the speed at which food moves through the stomach and intestines (gastrointestinal tract). Things to avoid include:  High-fiber foods, such as dried fruit, raw fruits and vegetables, nuts, seeds, and whole grain foods.  Spicy foods and high-fat foods.  Foods and beverages sweetened with high-fructose corn syrup, honey, or sugar alcohols such as xylitol, sorbitol, and mannitol. WHAT FOODS ARE RECOMMENDED? Grains White rice. White, [place], or pita breads (fresh or toasted), including plain rolls, buns, or bagels. White pasta. Saltine, soda, or graham crackers. Pretzels. Low-fiber cereal. Cooked cereals made with water (such as cornmeal, farina, or cream cereals). Plain muffins. Matzo. Melba toast. Zwieback.  Vegetables Potatoes (without the skin). Strained tomato and vegetable juices. Most well-cooked and canned vegetables without seeds. Tender lettuce. Fruits Cooked or canned applesauce, apricots, cherries, fruit cocktail, grapefruit, peaches, pears, or plums. Fresh bananas, apples without skin, cherries, grapes, cantaloupe, grapefruit, peaches, oranges, or plums.  Meat and Other Protein Products Baked or boiled chicken. Eggs. Tofu. Fish. Seafood. Smooth peanut butter. Ground or well-cooked tender beef, ham, veal, lamb, pork, or poultry.  Dairy Plain yogurt, kefir, and unsweetened liquid yogurt. Lactose-free milk, buttermilk, or soy milk. Plain hard cheese. Beverages Sport drinks. Clear broths. Diluted fruit juices (except prune). Regular, caffeine-free sodas such as ginger ale. Water. Decaffeinated teas. Oral rehydration solutions. Sugar-free beverages not sweetened with sugar alcohols. Other Bouillon, broth, or soups made from recommended foods.  The items listed above may not be a complete list of recommended foods or  beverages. Contact your dietitian for more options. WHAT FOODS ARE NOT RECOMMENDED? Grains Whole grain, whole wheat, bran, or rye breads, rolls, pastas, crackers, and cereals. Wild or brown rice. Cereals that contain more than 2 g of fiber per serving. Corn tortillas or taco shells. Cooked or dry oatmeal. Granola. Popcorn. Vegetables Raw vegetables. Cabbage, broccoli, Brussels sprouts, artichokes, baked beans, beet greens, corn, kale, legumes, peas, sweet potatoes, and yams. Potato skins. Cooked spinach and cabbage. Fruits Dried fruit, including raisins and dates. Raw fruits. Stewed or dried prunes. Fresh apples with skin, apricots, mangoes, pears, raspberries, and strawberries.  Meat and Other Protein Products Chunky peanut butter. Nuts and seeds. Beans and lentils. Tomasa Blase.  Dairy High-fat cheeses. Milk, chocolate milk, and beverages made with milk, such as milk shakes. Cream. Ice cream. Sweets and Desserts Sweet rolls, doughnuts, and sweet breads. Pancakes and waffles. Fats and Oils Butter. Cream sauces. Margarine. Salad oils. Plain salad dressings. Olives. Avocados.  Beverages Caffeinated beverages (such as coffee, tea, soda, or energy drinks). Alcoholic beverages. Fruit juices with pulp. Prune juice. Soft drinks sweetened with high-fructose corn syrup or sugar alcohols. Other Coconut. Hot sauce. Chili powder. Mayonnaise. Gravy. Cream-based or milk-based soups.  The items listed above may not be a complete list of foods and beverages to avoid. Contact your dietitian for more information. WHAT SHOULD I DO IF I BECOME DEHYDRATED? Diarrhea can sometimes lead to dehydration. Signs of dehydration include dark urine and dry mouth  and skin. If you think you are dehydrated, you should rehydrate with an oral rehydration solution. These solutions can be purchased at pharmacies, retail stores, or online.  Drink -1 cup (120-240 mL) of oral rehydration solution each time you have an episode of diarrhea.  If drinking this amount makes your diarrhea worse, try drinking smaller amounts more often. For example, drink 1-3 tsp (5-15 mL) every 5-10 minutes.  A general rule for staying hydrated is to drink 1-2 L of fluid per day. Talk to your health care provider about the specific amount you should be drinking each day. Drink enough fluids to keep your urine clear or pale yellow. Document Released: 06/13/2003 Document Revised: 03/28/2013 Document Reviewed: 02/13/2013 [facility] Patient Information 2015 [place], [place]. This information is not intended to replace advice given to you by your health care provider. Make sure you discuss any questions you have with your health care provider.  Muscle Pain Muscle pain (myalgia) may be caused by many things, including:  Overuse or muscle strain, especially if you are not in shape. This is the most common cause of muscle pain.  Injury.  Bruises.  Viruses, such as the flu.  Infectious diseases.  Fibromyalgia, which is a chronic condition that causes muscle tenderness, fatigue, and headache.  Autoimmune diseases, including lupus.  Certain drugs, including ACE inhibitors and statins. Muscle pain may be mild or severe. In most cases, the pain lasts only a short time and goes away without treatment. To diagnose the cause of your muscle pain, your health care provider will take your medical history. This means he or she will ask you when your muscle pain began and what has been happening. If you have not had muscle pain for very long, your health care provider may want to wait before doing much testing. If your muscle pain has lasted a long time, your health care provider may want to run tests right away. If your health care provider thinks your muscle pain may be caused by illness, you may need to have additional tests to rule out certain conditions.  Treatment for muscle pain depends on the cause. Home care is often enough to relieve muscle pain. Your health care  provider may also prescribe anti-inflammatory medicine. HOME CARE INSTRUCTIONS Watch your condition for any changes. The following actions may help to lessen any discomfort you are feeling:  Only take over-the-counter or prescription medicines as directed by your health care provider.  Apply ice to the sore muscle:  Put ice in a plastic bag.  Place a towel between your skin and the bag.  Leave the ice on for 15-20 minutes, 3-4 times a day.  You may alternate applying hot and cold packs to the muscle as directed by your health care provider.  If overuse is causing your muscle pain, slow down your activities until the pain goes away.  Remember that it is normal to feel some muscle pain after starting a workout program. Muscles that have not been used often will be sore at first.  Do regular, gentle exercises if you are not usually active.  Warm up before exercising to lower your risk of muscle pain.  Do not continue working out if the pain is very bad. Bad pain could mean you have injured a muscle. SEEK MEDICAL CARE IF:  Your muscle pain gets worse, and medicines do not help.  You have muscle pain that lasts longer than 3 days.  You have a rash or fever along with muscle pain.  You have muscle pain after a tick bite.  You have muscle pain while working out, even though you are in good physical condition.  You have redness, soreness, or swelling along with muscle pain.  You have muscle pain after starting a new medicine or changing the dose of a medicine. SEEK IMMEDIATE MEDICAL CARE IF:  You have trouble breathing.  You have trouble swallowing.  You have muscle pain along with a stiff neck, fever, and vomiting.  You have severe muscle weakness or cannot move part of your body. MAKE SURE YOU:   Understand these instructions.  Will watch your condition.  Will get help right away if you are not doing well or get worse. Document Released: 02/12/2006 Document Revised:  03/28/2013 Document Reviewed: 01/17/2013 [facility] Patient Information 2015 [place], [place]. This information is not intended to replace advice given to you by your health care provider. Make sure you discuss any questions you have with your health care provider.

## 2015-11-07 ENCOUNTER — Encounter (HOSPITAL_COMMUNITY): Payer: Self-pay | Admitting: Emergency Medicine

## 2015-11-07 ENCOUNTER — Emergency Department (HOSPITAL_COMMUNITY)
Admission: EM | Admit: 2015-11-07 | Discharge: 2015-11-07 | Disposition: A | Discharge status: Home / Self Care | Payer: Self-pay | Attending: Emergency Medicine | Admitting: Emergency Medicine

## 2015-11-07 ENCOUNTER — Emergency Department (HOSPITAL_COMMUNITY): Payer: Self-pay

## 2015-11-07 DIAGNOSIS — S0990XA Unspecified injury of head, initial encounter: Secondary | ICD-10-CM | POA: Insufficient documentation

## 2015-11-07 DIAGNOSIS — F17210 Nicotine dependence, cigarettes, uncomplicated: Secondary | ICD-10-CM | POA: Insufficient documentation

## 2015-11-07 DIAGNOSIS — S52202A Unspecified fracture of shaft of left ulna, initial encounter for closed fracture: Secondary | ICD-10-CM | POA: Insufficient documentation

## 2015-11-07 DIAGNOSIS — W1830XA Fall on same level, unspecified, initial encounter: Secondary | ICD-10-CM | POA: Insufficient documentation

## 2015-11-07 DIAGNOSIS — Y929 Unspecified place or not applicable: Secondary | ICD-10-CM | POA: Insufficient documentation

## 2015-11-07 DIAGNOSIS — Y939 Activity, unspecified: Secondary | ICD-10-CM | POA: Insufficient documentation

## 2015-11-07 DIAGNOSIS — I10 Essential (primary) hypertension: Secondary | ICD-10-CM | POA: Insufficient documentation

## 2015-11-07 DIAGNOSIS — Z79899 Other long term (current) drug therapy: Secondary | ICD-10-CM | POA: Insufficient documentation

## 2015-11-07 DIAGNOSIS — Y999 Unspecified external cause status: Secondary | ICD-10-CM | POA: Insufficient documentation

## 2015-11-07 DIAGNOSIS — S0181XA Laceration without foreign body of other part of head, initial encounter: Secondary | ICD-10-CM | POA: Insufficient documentation

## 2015-11-07 MED ORDER — IBUPROFEN 400 MG PO TABS
600.0000 mg | ORAL_TABLET | Freq: Once | ORAL | Status: AC
  Administered 2015-11-07: 600 mg via ORAL
  Filled 2015-11-07: qty 1

## 2015-11-07 MED ORDER — NAPROXEN 500 MG PO TABS: 500.0000 mg | ORAL_TABLET | Freq: Two times a day (BID) | ORAL | 0 refills | Status: DC

## 2015-11-07 MED ORDER — HYDROCODONE-ACETAMINOPHEN 5-325 MG PO TABS
1.0000 | ORAL_TABLET | Freq: Once | ORAL | Status: AC
  Administered 2015-11-07: 1 via ORAL
  Filled 2015-11-07: qty 1

## 2015-11-07 MED ORDER — TETANUS-DIPHTH-ACELL PERTUSSIS 5-2.5-18.5 LF-MCG/0.5 IM SUSP
0.5000 mL | Freq: Once | INTRAMUSCULAR | Status: AC
  Administered 2015-11-07: 0.5 mL via INTRAMUSCULAR
  Filled 2015-11-07: qty 0.5

## 2015-11-07 MED ORDER — LIDOCAINE-EPINEPHRINE (PF) 2 %-1:200000 IJ SOLN
10.0000 mL | Freq: Once | INTRAMUSCULAR | Status: AC
  Administered 2015-11-07: 10 mL
  Filled 2015-11-07: qty 20

## 2015-11-07 MED ORDER — OXYCODONE-ACETAMINOPHEN 5-325 MG PO TABS: 1.0000 | ORAL_TABLET | Freq: Four times a day (QID) | ORAL | 0 refills | Status: DC | PRN

## 2015-11-07 MED ORDER — ONDANSETRON 4 MG PO TBDP: 4.0000 mg | ORAL_TABLET | Freq: Three times a day (TID) | ORAL | 0 refills | Status: DC | PRN

## 2015-11-07 MED ORDER — BUPIVACAINE HCL (PF) 0.5 % IJ SOLN
10.0000 mL | Freq: Once | INTRAMUSCULAR | Status: AC
  Administered 2015-11-07: 10 mL
  Filled 2015-11-07: qty 10

## 2015-11-07 NOTE — Progress Notes (Signed)
Orthopedic Tech Progress Note Patient Details:  Kelsey Hines October 22, 1970 [id number]  Ortho Devices Type of Ortho Device: Sugartong splint Ortho Device/Splint Location: lue Ortho Device/Splint Interventions: Ordered, Application  Applied sugartong splint to lue after talking to dr about it being better for the pt. He agreed and aproved. Trinna Post 11/07/2015, 5:59 AM

## 2015-11-07 NOTE — ED Notes (Signed)
Patient ambulated to the restroom and back without difficulty.  No complaints voiced

## 2015-11-07 NOTE — ED Notes (Signed)
Orthopedic paged and notified of splint need.
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
## 2015-11-07 NOTE — ED Notes (Signed)
Discharge instructions and prescriptions reviewed - voiced understanding 

## 2015-11-07 NOTE — ED Notes (Signed)
Splint applied and sling in place
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
## 2015-11-18 ENCOUNTER — Ambulatory Visit (HOSPITAL_COMMUNITY)
Admission: EM | Admit: 2015-11-18 | Discharge: 2015-11-18 | Disposition: A | Discharge status: Home / Self Care | Payer: BLUE CROSS/BLUE SHIELD | Attending: Family Medicine | Admitting: Family Medicine

## 2015-11-18 ENCOUNTER — Encounter (HOSPITAL_COMMUNITY): Payer: Self-pay | Admitting: Emergency Medicine

## 2015-11-18 DIAGNOSIS — S62102D Fracture of unspecified carpal bone, left wrist, subsequent encounter for fracture with routine healing: Secondary | ICD-10-CM

## 2015-11-18 MED ORDER — OXYCODONE-ACETAMINOPHEN 10-325 MG PO TABS: 1.0000 | ORAL_TABLET | ORAL | 0 refills | Status: DC | PRN
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
## 2015-11-21 ENCOUNTER — Encounter: Payer: Self-pay | Admitting: Family Medicine

## 2015-11-21 ENCOUNTER — Ambulatory Visit (INDEPENDENT_AMBULATORY_CARE_PROVIDER_SITE_OTHER): Payer: Self-pay | Admitting: Family Medicine

## 2015-11-21 ENCOUNTER — Ambulatory Visit (HOSPITAL_BASED_OUTPATIENT_CLINIC_OR_DEPARTMENT_OTHER)
Admission: RE | Admit: 2015-11-21 | Discharge: 2015-11-21 | Disposition: A | Discharge status: Home / Self Care | Payer: Medicaid Other | Source: Ambulatory Visit | Attending: Family Medicine | Admitting: Family Medicine

## 2015-11-21 VITALS — BP 149/95 | Ht 67.0 in | Wt 220.0 lb

## 2015-11-21 DIAGNOSIS — S52202A Unspecified fracture of shaft of left ulna, initial encounter for closed fracture: Secondary | ICD-10-CM

## 2015-11-21 DIAGNOSIS — S6992XA Unspecified injury of left wrist, hand and finger(s), initial encounter: Secondary | ICD-10-CM | POA: Insufficient documentation

## 2015-11-21 DIAGNOSIS — X58XXXA Exposure to other specified factors, initial encounter: Secondary | ICD-10-CM | POA: Insufficient documentation

## 2015-11-21 DIAGNOSIS — S52692A Other fracture of lower end of left ulna, initial encounter for closed fracture: Secondary | ICD-10-CM | POA: Insufficient documentation

## 2015-11-21 MED ORDER — OXYCODONE-ACETAMINOPHEN 10-325 MG PO TABS: 1.0000 | ORAL_TABLET | ORAL | 0 refills | Status: DC | PRN

## 2015-11-21 MED FILL — OXYCODONE-APAP 10-325 TAB: 10-325 | 10 days supply | Qty: 60 | Fill #0

## 2015-11-21 NOTE — Patient Instructions (Addendum)
You have a distal ulna fracture. Keep elevated above your heart as much as possible. Percocet as needed for severe pain. Aleve 2 tabs twice a day with food (when you run out of the naprosyn). Follow up with me in 2 weeks to remove the cast, repeat x-rays.

## 2015-11-22 ENCOUNTER — Inpatient Hospital Stay: Payer: BLUE CROSS/BLUE SHIELD | Admitting: Family Medicine

## 2015-11-25 DIAGNOSIS — S52202A Unspecified fracture of shaft of left ulna, initial encounter for closed fracture: Secondary | ICD-10-CM | POA: Insufficient documentation

## 2015-11-25 NOTE — Assessment & Plan Note (Signed)
Left distal ulna fracture - independently reviewed radiographs.  Distal ulna fracture again noted - no intraarticular component.  Appears to be enough contact between fracture fragments and <50% displacement - she should do well with conservative treatment.  Short arm cast placed today.  Elevation, naproxen with percocet as needed for pain.  F/u in 2 weeks, remove cast and repeat radiographs.
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
## 2015-12-03 ENCOUNTER — Telehealth: Payer: Self-pay | Admitting: Family Medicine

## 2015-12-03 MED ORDER — OXYCODONE-ACETAMINOPHEN 7.5-325 MG PO TABS: 1.0000 | ORAL_TABLET | Freq: Four times a day (QID) | ORAL | 0 refills | Status: DC | PRN

## 2015-12-03 NOTE — Telephone Encounter (Signed)
Given new script for Percocet - will have to pick this up.  Thanks!

## 2015-12-04 MED FILL — OXYCODONE-APAP 7.5-325 MG: 7.5-325 | 15 days supply | Qty: 60 | Fill #0

## 2015-12-04 NOTE — Telephone Encounter (Signed)
Took script to front desk on 12-03-15 to be picked up by patient.

## 2015-12-05 ENCOUNTER — Ambulatory Visit (HOSPITAL_BASED_OUTPATIENT_CLINIC_OR_DEPARTMENT_OTHER)
Admission: RE | Admit: 2015-12-05 | Discharge: 2015-12-05 | Disposition: A | Discharge status: Home / Self Care | Payer: Self-pay | Source: Ambulatory Visit | Attending: Family Medicine | Admitting: Family Medicine

## 2015-12-05 ENCOUNTER — Ambulatory Visit: Payer: Medicaid Other | Admitting: Family Medicine

## 2015-12-05 ENCOUNTER — Encounter: Payer: Self-pay | Admitting: Family Medicine

## 2015-12-05 ENCOUNTER — Ambulatory Visit (INDEPENDENT_AMBULATORY_CARE_PROVIDER_SITE_OTHER): Payer: Self-pay | Admitting: Family Medicine

## 2015-12-05 VITALS — BP 114/73 | HR 79 | Ht 68.0 in | Wt 220.0 lb

## 2015-12-05 DIAGNOSIS — S6992XD Unspecified injury of left wrist, hand and finger(s), subsequent encounter: Secondary | ICD-10-CM | POA: Insufficient documentation

## 2015-12-05 DIAGNOSIS — S52692A Other fracture of lower end of left ulna, initial encounter for closed fracture: Secondary | ICD-10-CM | POA: Insufficient documentation

## 2015-12-05 DIAGNOSIS — S52202D Unspecified fracture of shaft of left ulna, subsequent encounter for closed fracture with routine healing: Secondary | ICD-10-CM

## 2015-12-05 DIAGNOSIS — X58XXXD Exposure to other specified factors, subsequent encounter: Secondary | ICD-10-CM | POA: Insufficient documentation

## 2015-12-05 DIAGNOSIS — X58XXXA Exposure to other specified factors, initial encounter: Secondary | ICD-10-CM | POA: Insufficient documentation

## 2015-12-10 NOTE — Assessment & Plan Note (Signed)
Left distal ulna fracture - independently reviewed repeat radiographs.  Distal ulna fracture again noted - no intraarticular component and also no healing though.  Still appears to be enough contact between fracture fragments and <50% displacement - she should do well with conservative treatment.  Another short arm cast placed today.  Elevation, naproxen with percocet as needed for pain.  F/u in 2 weeks and remove cast, repeat radiographs.
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
## 2015-12-13 ENCOUNTER — Telehealth: Payer: Self-pay | Admitting: Family Medicine

## 2015-12-13 NOTE — Telephone Encounter (Signed)
Spoke to patient and told her that it was too early to refill the medication. Said she did not have enough to last until her appointment.

## 2015-12-13 NOTE — Telephone Encounter (Signed)
It's too early to refill this - she should have enough to last her to her appointment if she's taking it as directed.

## 2015-12-19 ENCOUNTER — Encounter: Payer: Self-pay | Admitting: Family Medicine

## 2015-12-19 ENCOUNTER — Ambulatory Visit (HOSPITAL_BASED_OUTPATIENT_CLINIC_OR_DEPARTMENT_OTHER)
Admission: RE | Admit: 2015-12-19 | Discharge: 2015-12-19 | Disposition: A | Discharge status: Home / Self Care | Payer: Self-pay | Source: Ambulatory Visit | Attending: Family Medicine | Admitting: Family Medicine

## 2015-12-19 ENCOUNTER — Ambulatory Visit (INDEPENDENT_AMBULATORY_CARE_PROVIDER_SITE_OTHER): Payer: Self-pay | Admitting: Family Medicine

## 2015-12-19 VITALS — BP 144/88 | HR 66 | Ht 66.0 in | Wt 220.0 lb

## 2015-12-19 DIAGNOSIS — X58XXXD Exposure to other specified factors, subsequent encounter: Secondary | ICD-10-CM | POA: Insufficient documentation

## 2015-12-19 DIAGNOSIS — S52202D Unspecified fracture of shaft of left ulna, subsequent encounter for closed fracture with routine healing: Secondary | ICD-10-CM

## 2015-12-19 MED ORDER — HYDROCODONE-ACETAMINOPHEN 5-325 MG PO TABS: 1.0000 | ORAL_TABLET | Freq: Four times a day (QID) | ORAL | 0 refills | Status: DC | PRN

## 2015-12-19 MED FILL — HYDROCODON-APAP 5-325: 5-325 | 7 days supply | Qty: 30 | Fill #0

## 2015-12-19 NOTE — Patient Instructions (Signed)
Wear the wrist brace as often as possible the next 2 weeks. Keep elevated above your heart as much as possible. Norco as needed for severe pain - this will be the last script though. Aleve 2 tabs twice a day with food (when you run out of the naprosyn). Follow up with me in 2 weeks for reevaluation.

## 2015-12-24 NOTE — Assessment & Plan Note (Signed)
Left distal ulna fracture - independently reviewed repeat radiographs.  Callus formation present without additional displacement.  Clinically improving as well.  Given there is callus will switch her to a wrist brace for 2 weeks, reevaluate at that time.  Naproxen with norco as needed for pain.
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
## 2016-01-02 ENCOUNTER — Ambulatory Visit: Payer: Medicaid Other | Admitting: Family Medicine

## 2016-01-06 ENCOUNTER — Ambulatory Visit: Payer: Medicaid Other | Admitting: Family Medicine

## 2016-01-06 ENCOUNTER — Ambulatory Visit: Payer: Self-pay | Admitting: Family Medicine

## 2016-06-21 ENCOUNTER — Ambulatory Visit (HOSPITAL_COMMUNITY)
Admission: EM | Admit: 2016-06-21 | Discharge: 2016-06-21 | Disposition: A | Discharge status: Home / Self Care | Payer: PRIVATE HEALTH INSURANCE | Attending: Family Medicine | Admitting: Family Medicine

## 2016-06-21 ENCOUNTER — Encounter (HOSPITAL_COMMUNITY): Payer: Self-pay | Admitting: *Deleted

## 2016-06-21 DIAGNOSIS — F332 Major depressive disorder, recurrent severe without psychotic features: Secondary | ICD-10-CM

## 2016-06-21 MED ORDER — ARIPIPRAZOLE 5 MG PO TABS: 5.0000 mg | ORAL_TABLET | Freq: Every day | ORAL | 1 refills | Status: DC

## 2016-06-21 NOTE — ED Triage Notes (Signed)
Pt with poor eye contact, quiet, tearful.  c/O hx depression; states believes it has gotten worse over past week following influenza.  Admits to being under great deal of stress.  Denies suicidal or homicidal ideation.

## 2016-06-21 NOTE — ED Provider Notes (Signed)
[facility]    CSN: [id number] Arrival date & time: 06/21/16  1207     History   Chief Complaint Chief Complaint  Patient presents with  . Depression  . Headache    HPI Kelsey Hines is a 46 y.o. female.   Pt with poor eye contact, quiet, tearful.  c/O hx depression; states believes it has gotten worse over past week following influenza.  Admits to being under great deal of stress.  Denies suicidal or homicidal ideation.  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 She has 2 children, daughter 67 and a son 73. She is a single parent. She's been having problem with depression for over a year. This is a recurrent problem and she is taking Abilify and Lexapro in the past for this. She felt like the Lexapro was having side effects that she did not like but thought that the Abilify worked better for her.  Patient has been extremely depressed over the last week such that she has had difficulty getting up and taking a shower. She's been crying constantly.  Patient's had crises this past year involving her mother who had a stroke and her son and has psychological problems. She's not suicidal but is looking for some help and is difficult time.      Past Medical History:  Diagnosis Date  . Ankylosis   . Arthritis   . Depression   . Herniated disc   . Hypertension   . Scoliosis (and kyphoscoliosis), idiopathic     Patient Active Problem List   Diagnosis Date Noted  . Closed fracture of left ulna 11/25/2015    Past Surgical History:  Procedure Laterality Date  . KNEE SURGERY    . SHOULDER ARTHROSCOPY      OB History    No data available       Home Medications    Prior to Admission medications   Medication Sig Start Date End Date Taking? Authorizing Provider  ARIPiprazole (ABILIFY) 5 MG tablet Take 1 tablet (5 mg total) by mouth daily. 06/21/16   Elvina Sidle, MD    Family History No family history on file.  Social History Social  History  Substance Use Topics  . Smoking status: Former Smoker    Packs/day: 0.00    Types: Cigarettes  . Smokeless tobacco: Never Used     Comment: quit 2016  . Alcohol use No     Allergies   Avelox [moxifloxacin hcl in nacl]   Review of Systems Review of Systems  Constitutional: Positive for activity change and appetite change.  Psychiatric/Behavioral: Positive for dysphoric mood.     Physical Exam Triage Vital Signs ED Triage Vitals  Enc Vitals Group     BP 06/21/16 1240 (!) 145/93     Pulse Rate 06/21/16 1240 82     Resp 06/21/16 1240 14     Temp 06/21/16 1240 98.1 F (36.7 C)     Temp Source 06/21/16 1240 Oral     SpO2 06/21/16 1240 100 %     Weight --      Height --      Head Circumference --      Peak Flow --      Pain Score 06/21/16 1243 5     Pain Loc --      Pain Edu? --      Excl. in GC? --    No data found.   Updated Vital Signs BP (!) 145/93  Pulse 82   Temp 98.1 F (36.7 C) (Oral)   Resp 14   LMP 09/25/2013   SpO2 100%    Physical Exam  Constitutional: She is oriented to person, place, and time. She appears well-developed and well-nourished.  HENT:  Right Ear: External ear normal.  Left Ear: External ear normal.  Mouth/Throat: Oropharynx is clear and moist.  Eyes: Conjunctivae and EOM are normal. Pupils are equal, round, and reactive to light.  Neck: Normal range of motion. Neck supple.  Pulmonary/Chest: Effort normal.  Musculoskeletal: Normal range of motion.  Neurological: She is alert and oriented to person, place, and time.  Skin: Skin is warm and dry.  Psychiatric:  I spent 20 minutes reviewing patient's history, her symptoms, and her willingness to engage in psychotherapy.  Nursing note and vitals reviewed.    UC Treatments / Results  Labs (all labs ordered are listed, but only abnormal results are displayed) Labs Reviewed - No data to display  EKG  EKG Interpretation None       Radiology No results  found.  Procedures Procedures (including critical care time)  Medications Ordered in UC Medications - No data to display   Initial Impression / Assessment and Plan / UC Course  I have reviewed the triage vital signs and the nursing notes.  Pertinent labs & imaging results that were available during my care of the patient were reviewed by me and considered in my medical decision making (see chart for details).     Final Clinical Impressions(s) / UC Diagnoses   Final diagnoses:  Severe episode of recurrent major depressive disorder, without psychotic features (HCC)    New Prescriptions New Prescriptions   ARIPIPRAZOLE (ABILIFY) 5 MG TABLET    Take 1 tablet (5 mg total) by mouth daily.     Elvina SidleKurt Chen Saadeh, MD 06/21/16 [id number]

## 2016-06-21 NOTE — Discharge Instructions (Signed)
Please call 1 of the psychiatrists listed above for further evaluation and help. Consider looking up mindfulness.

## 2016-06-23 ENCOUNTER — Ambulatory Visit (HOSPITAL_COMMUNITY)
Admission: RE | Admit: 2016-06-23 | Discharge: 2016-06-23 | Disposition: A | Discharge status: Home / Self Care | Payer: PRIVATE HEALTH INSURANCE | Attending: Psychiatry | Admitting: Psychiatry

## 2016-06-23 DIAGNOSIS — F329 Major depressive disorder, single episode, unspecified: Secondary | ICD-10-CM | POA: Insufficient documentation

## 2016-06-23 DIAGNOSIS — Z811 Family history of alcohol abuse and dependence: Secondary | ICD-10-CM | POA: Diagnosis not present

## 2016-06-23 DIAGNOSIS — Z87891 Personal history of nicotine dependence: Secondary | ICD-10-CM | POA: Diagnosis not present

## 2016-06-23 DIAGNOSIS — M1990 Unspecified osteoarthritis, unspecified site: Secondary | ICD-10-CM | POA: Insufficient documentation

## 2016-06-23 DIAGNOSIS — Z881 Allergy status to other antibiotic agents status: Secondary | ICD-10-CM | POA: Diagnosis not present

## 2016-06-23 DIAGNOSIS — I10 Essential (primary) hypertension: Secondary | ICD-10-CM | POA: Diagnosis not present
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
## 2016-06-23 NOTE — H&P (Signed)
Behavioral Health Medical Screening Exam  Kelsey BoozerLatonya Hines is an 46 y.o. female.  Total Time spent with patient: 15 minutes  Psychiatric Specialty Exam: Physical Exam  Constitutional: She is oriented to person, place, and time. She appears well-developed and well-nourished.  HENT:  Head: Normocephalic.  Eyes: Pupils are equal, round, and reactive to light.  Neck: Normal range of motion.  Cardiovascular: Normal rate, regular rhythm and normal heart sounds.   Respiratory: Effort normal and breath sounds normal.  GI: Soft. Bowel sounds are normal.  Musculoskeletal: Normal range of motion.  Neurological: She is alert and oriented to person, place, and time.  Skin: Skin is warm and dry.    Review of Systems  Psychiatric/Behavioral: Positive for depression. Negative for hallucinations, substance abuse and suicidal ideas. The patient is nervous/anxious and has insomnia.   All other systems reviewed and are negative.   Last menstrual period 09/25/2013.There is no height or weight on file to calculate BMI.  General Appearance: Casual and Fairly Groomed  Eye Contact:  Good  Speech:  Clear and Coherent and Normal Rate  Volume:  Normal  Mood:  Anxious and Depressed  Affect:  Appropriate, Congruent and Depressed  Thought Process:  Coherent, Goal Directed, Linear and Descriptions of Associations: Intact  Orientation:  Full (Time, Place, and Person)  Thought Content:  Focused on outpatient resources  Suicidal Thoughts:  No  Homicidal Thoughts:  No  Memory:  Immediate;   Fair Recent;   Fair Remote;   Fair  Judgement:  Fair  Insight:  Fair  Psychomotor Activity:  Normal  Concentration: Concentration: Fair and Attention Span: Fair  Recall:  FiservFair  Fund of Knowledge:Fair  Language: Fair  Akathisia:  No  Handed:    AIMS (if indicated):     Assets:  Communication Skills Desire for Improvement Resilience Social Support  Sleep:       Musculoskeletal: Strength & Muscle Tone: within  normal limits Gait & Station: normal Patient leans: N/A  Last menstrual period 09/25/2013.  Recommendations:  Based on my evaluation the patient does not appear to have an emergency medical condition.  Beau FannyWithrow, Lynzi Meulemans C, FNP 06/23/2016, 8:12 PM

## 2016-11-17 ENCOUNTER — Emergency Department (HOSPITAL_COMMUNITY)
Admission: EM | Admit: 2016-11-17 | Discharge: 2016-11-18 | Disposition: A | Discharge status: Home / Self Care | Payer: PRIVATE HEALTH INSURANCE | Attending: Emergency Medicine | Admitting: Emergency Medicine

## 2016-11-17 ENCOUNTER — Emergency Department (HOSPITAL_COMMUNITY): Payer: PRIVATE HEALTH INSURANCE

## 2016-11-17 DIAGNOSIS — Y9389 Activity, other specified: Secondary | ICD-10-CM | POA: Insufficient documentation

## 2016-11-17 DIAGNOSIS — S82141A Displaced bicondylar fracture of right tibia, initial encounter for closed fracture: Secondary | ICD-10-CM | POA: Insufficient documentation

## 2016-11-17 DIAGNOSIS — Y999 Unspecified external cause status: Secondary | ICD-10-CM | POA: Diagnosis not present

## 2016-11-17 DIAGNOSIS — S8991XA Unspecified injury of right lower leg, initial encounter: Secondary | ICD-10-CM | POA: Diagnosis present

## 2016-11-17 DIAGNOSIS — I10 Essential (primary) hypertension: Secondary | ICD-10-CM | POA: Diagnosis not present

## 2016-11-17 DIAGNOSIS — Z87891 Personal history of nicotine dependence: Secondary | ICD-10-CM | POA: Diagnosis not present

## 2016-11-17 DIAGNOSIS — Y929 Unspecified place or not applicable: Secondary | ICD-10-CM | POA: Insufficient documentation

## 2016-11-17 MED ORDER — HYDROCODONE-ACETAMINOPHEN 5-325 MG PO TABS
1.0000 | ORAL_TABLET | Freq: Once | ORAL | Status: AC
  Administered 2016-11-17: 1 via ORAL
  Filled 2016-11-17: qty 1

## 2016-11-17 MED ORDER — OXYCODONE-ACETAMINOPHEN 5-325 MG PO TABS: 1.0000 | ORAL_TABLET | Freq: Once | ORAL | Status: DC

## 2016-11-17 MED ORDER — OXYCODONE-ACETAMINOPHEN 5-325 MG PO TABS
1.0000 | ORAL_TABLET | Freq: Once | ORAL | Status: AC
  Administered 2016-11-17: 1 via ORAL
  Filled 2016-11-17: qty 1

## 2016-11-17 MED ORDER — FENTANYL CITRATE (PF) 100 MCG/2ML IJ SOLN
25.0000 ug | Freq: Once | INTRAMUSCULAR | Status: AC
Start: 2016-11-17 — End: 2016-11-17
  Administered 2016-11-17: 25 ug via INTRAVENOUS
  Filled 2016-11-17: qty 2

## 2016-11-17 NOTE — ED Notes (Signed)
MRI called unit and informed RN that pt could not tolerate MRI. EDP notified and ordered 25 mcg fentanyl.
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
## 2016-11-17 NOTE — ED Notes (Signed)
Pt to MRI

## 2016-11-17 NOTE — ED Notes (Signed)
Pt back from MRI. MRI staff report that pt was unable to tolerate MRI even with pain medication. EDP notified.
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
## 2016-11-18 ENCOUNTER — Emergency Department (HOSPITAL_COMMUNITY): Payer: PRIVATE HEALTH INSURANCE

## 2016-11-18 ENCOUNTER — Encounter (HOSPITAL_COMMUNITY): Payer: Self-pay | Admitting: Radiology

## 2016-11-18 MED ORDER — OXYCODONE-ACETAMINOPHEN 5-325 MG PO TABS
ORAL_TABLET | ORAL | Status: AC
  Filled 2016-11-18: qty 1

## 2016-11-18 MED ORDER — OXYCODONE-ACETAMINOPHEN 5-325 MG PO TABS
1.0000 | ORAL_TABLET | Freq: Once | ORAL | Status: AC
  Administered 2016-11-18: 1 via ORAL

## 2016-11-18 MED ORDER — HYDROCODONE-ACETAMINOPHEN 5-325 MG PO TABS: 2.0000 | ORAL_TABLET | ORAL | 0 refills | Status: DC | PRN

## 2016-11-18 MED ORDER — PSEUDOEPHEDRINE HCL ER 120 MG PO TB12
120.0000 mg | ORAL_TABLET | Freq: Two times a day (BID) | ORAL | Status: DC
  Administered 2016-11-18: 120 mg via ORAL
  Filled 2016-11-18 (×2): qty 1

## 2016-11-18 MED ORDER — KETOROLAC TROMETHAMINE 60 MG/2ML IM SOLN
60.0000 mg | Freq: Once | INTRAMUSCULAR | Status: AC
  Administered 2016-11-18: 60 mg via INTRAMUSCULAR
  Filled 2016-11-18: qty 2

## 2016-11-18 MED ORDER — FLUTICASONE PROPIONATE 50 MCG/ACT NA SUSP
2.0000 | Freq: Every day | NASAL | Status: DC
  Administered 2016-11-18: 2 via NASAL
  Filled 2016-11-18: qty 16

## 2016-11-18 NOTE — ED Notes (Signed)
Pt is concerned that she will not be able to use the crutches and has limited help at home Pt wants to talk to a social worker this am about getting a wheelchair or walker

## 2016-11-18 NOTE — ED Notes (Signed)
Pt waiting for a ride home 

## 2016-11-18 NOTE — Discharge Instructions (Signed)
1. Medications: vicodin, usual home medications 2. Treatment: rest, drink plenty of fluids, wear brace, do not walk on your leg, use crutches 3. Follow Up: Please followup with ortho in 7 days for discussion of your diagnoses and further evaluation after today's visit; if you do not have a primary care doctor use the resource guide provided to find one; Please return to the ER for numbness, weakness or other concerns

## 2016-11-18 NOTE — ED Notes (Signed)
Pt doesn't have a ride home until in the morning

## 2016-11-18 NOTE — Discharge Planning (Signed)
Kelsey Cohnamellia Josselyne Onofrio, RN, BSN, [place] [phone] Pt qualifies for DME rolling walker.  DME  ordered through [facility].  Shaune LeeksJermaine Jenkins of [facility] notified to deliver rolling walker to pt room prior to D/C home today.

## 2016-11-18 NOTE — Progress Notes (Signed)
CSW informed RNCM Durward MallardCamille about patient request for home equipment wheelchair or walker. CSW informed [employer] teachernurse secretary.
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
## 2017-01-07 ENCOUNTER — Ambulatory Visit (HOSPITAL_COMMUNITY)
Admission: EM | Admit: 2017-01-07 | Discharge: 2017-01-07 | Disposition: A | Discharge status: Home / Self Care | Payer: PRIVATE HEALTH INSURANCE | Attending: Family Medicine | Admitting: Family Medicine

## 2017-01-07 ENCOUNTER — Encounter (HOSPITAL_COMMUNITY): Payer: Self-pay | Admitting: Emergency Medicine

## 2017-01-07 ENCOUNTER — Telehealth (HOSPITAL_COMMUNITY): Payer: Self-pay | Admitting: Emergency Medicine

## 2017-01-07 ENCOUNTER — Telehealth (HOSPITAL_COMMUNITY): Payer: Self-pay | Admitting: Family Medicine

## 2017-01-07 DIAGNOSIS — M25562 Pain in left knee: Secondary | ICD-10-CM

## 2017-01-07 DIAGNOSIS — F321 Major depressive disorder, single episode, moderate: Secondary | ICD-10-CM

## 2017-01-07 MED ORDER — PREDNISONE 20 MG PO TABS: ORAL_TABLET | ORAL | 0 refills | Status: DC

## 2017-01-07 MED ORDER — FLUOXETINE HCL 20 MG PO TABS: 20.0000 mg | ORAL_TABLET | Freq: Every day | ORAL | 3 refills | Status: DC

## 2017-01-07 MED ORDER — KETOROLAC TROMETHAMINE 60 MG/2ML IM SOLN
INTRAMUSCULAR | Status: AC
  Filled 2017-01-07: qty 2

## 2017-01-07 MED ORDER — KETOROLAC TROMETHAMINE 60 MG/2ML IM SOLN
60.0000 mg | Freq: Once | INTRAMUSCULAR | Status: AC
  Administered 2017-01-07: 60 mg via INTRAMUSCULAR
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
## 2017-01-07 NOTE — Telephone Encounter (Signed)
Patient called about prednisone not being at [facility].    [facility] [address], [facility]ist said it was too soon to fill.  Decided this information was based on the [place] [facility] , then [facility] .  [facility] patient call the [place] location and [facility].    Attempted to call patient at number she left with staff.  No answer and mail box not established.  Notified staff if patient called back, to put her on hold.  Patient did call back and told her what pharmacist said to do.  Patient agreed to call pharmacy. Call ended.

## 2017-01-07 NOTE — Discharge Instructions (Signed)
Ice the knee at least twice a day  Return or see the orthopedic specialist if you're not better in 3 days  Go to [employer] today to get help with housing.

## 2017-01-07 NOTE — ED Triage Notes (Signed)
Pt reports left knee pain that is recurrent over the last 10 years.  The last two days she has been having a lot of pain.

## 2017-01-27 ENCOUNTER — Encounter (INDEPENDENT_AMBULATORY_CARE_PROVIDER_SITE_OTHER): Payer: Self-pay | Admitting: Physician Assistant

## 2017-01-27 ENCOUNTER — Ambulatory Visit (INDEPENDENT_AMBULATORY_CARE_PROVIDER_SITE_OTHER): Payer: PRIVATE HEALTH INSURANCE | Admitting: Physician Assistant

## 2017-01-27 VITALS — BP 190/80 | HR 56 | Temp 98.1°F | Wt 203.6 lb

## 2017-01-27 DIAGNOSIS — G4700 Insomnia, unspecified: Secondary | ICD-10-CM

## 2017-01-27 DIAGNOSIS — F332 Major depressive disorder, recurrent severe without psychotic features: Secondary | ICD-10-CM

## 2017-01-27 DIAGNOSIS — I499 Cardiac arrhythmia, unspecified: Secondary | ICD-10-CM

## 2017-01-27 MED ORDER — VITAMIN D-3 125 MCG (5000 UT) PO TABS: 1.0000 | ORAL_TABLET | Freq: Every day | ORAL | 0 refills | Status: DC

## 2017-01-27 MED ORDER — ESCITALOPRAM OXALATE 10 MG PO TABS: 10.0000 mg | ORAL_TABLET | Freq: Every day | ORAL | 5 refills | Status: DC

## 2017-01-27 MED ORDER — ZOLPIDEM TARTRATE 5 MG PO TABS: 5.0000 mg | ORAL_TABLET | Freq: Every evening | ORAL | 0 refills | Status: DC | PRN

## 2017-01-27 MED ORDER — GABAPENTIN 300 MG PO CAPS: 300.0000 mg | ORAL_CAPSULE | Freq: Three times a day (TID) | ORAL | 3 refills | Status: DC

## 2017-01-27 MED ORDER — CYANOCOBALAMIN 250 MCG PO TABS: 250.0000 ug | ORAL_TABLET | Freq: Every day | ORAL | 0 refills | Status: DC

## 2017-01-27 NOTE — Patient Instructions (Addendum)
Major Depressive Disorder, Adult Major depressive disorder (MDD) is a mental health condition. It may also be called clinical depression or unipolar depression. MDD usually causes feelings of sadness, hopelessness, or helplessness. MDD can also cause physical symptoms. It can interfere with work, school, relationships, and other everyday activities. MDD may be mild, moderate, or severe. It may occur once (single episode major depressive disorder) or it may occur multiple times (recurrent major depressive disorder). What are the causes? The exact cause of this condition is not known. MDD is most likely caused by a combination of things, which may include:  Genetic factors. These are traits that are passed along from parent to child.  Individual factors. Your personality, your behavior, and the way you handle your thoughts and feelings may contribute to MDD. This includes personality traits and behaviors learned from others.  Physical factors, such as: ? Differences in the part of your brain that controls emotion. This part of your brain may be different than it is in people who do not have MDD. ? Long-term (chronic) medical or psychiatric illnesses.  Social factors. Traumatic experiences or major life changes may play a role in the development of MDD.  What increases the risk? This condition is more likely to develop in women. The following factors may also make you more likely to develop MDD:  A family history of depression.  Troubled family relationships.  Abnormally low levels of certain brain chemicals.  Traumatic events in childhood, especially abuse or the loss of a parent.  Being under a lot of stress, or long-term stress, especially from upsetting life experiences or losses.  A history of: ? Chronic physical illness. ? Other mental health disorders. ? Substance abuse.  Poor living conditions.  Experiencing social exclusion or discrimination on a regular basis.  What are  the signs or symptoms? The main symptoms of MDD typically include:  Constant depressed or irritable mood.  Loss of interest in things and activities.  MDD symptoms may also include:  Sleeping or eating too much or too little.  Unexplained weight change.  Fatigue or low energy.  Feelings of worthlessness or guilt.  Difficulty thinking clearly or making decisions.  Thoughts of suicide or of harming others.  Physical agitation or weakness.  Isolation.  Severe cases of MDD may also occur with other symptoms, such as:  Delusions or hallucinations, in which you imagine things that are not real (psychotic depression).  Low-level depression that lasts at least a year (chronic depression or persistent depressive disorder).  Extreme sadness and hopelessness (melancholic depression).  Trouble speaking and moving (catatonic depression).  How is this diagnosed? This condition may be diagnosed based on:  Your symptoms.  Your medical history, including your mental health history. This may involve tests to evaluate your mental health. You may be asked questions about your lifestyle, including any drug and alcohol use, and how long you have had symptoms of MDD.  A physical exam.  Blood tests to rule out other conditions.  You must have a depressed mood and at least four other MDD symptoms most of the day, nearly every day in the same 2-week timeframe before your health care provider can confirm a diagnosis of MDD. How is this treated? This condition is usually treated by mental health professionals, such as psychologists, psychiatrists, and clinical social workers. You may need more than one type of treatment. Treatment may include:  Psychotherapy. This is also called talk therapy or counseling. Types of psychotherapy include: ? Cognitive behavioral   therapy (CBT). This type of therapy teaches you to recognize unhealthy feelings, thoughts, and behaviors, and replace them with  positive thoughts and actions. ? Interpersonal therapy (IPT). This helps you to improve the way you relate to and communicate with others. ? Family therapy. This treatment includes members of your family.  Medicine to treat anxiety and depression, or to help you control certain emotions and behaviors.  Lifestyle changes, such as: ? Limiting alcohol and drug use. ? Exercising regularly. ? Getting plenty of sleep. ? Making healthy eating choices. ? Spending more time outdoors.  Treatments involving stimulation of the brain can be used in situations with extremely severe symptoms, or when medicine or other therapies do not work over time. These treatments include electroconvulsive therapy, transcranial magnetic stimulation, and vagal nerve stimulation. Follow these instructions at home: Activity  Return to your normal activities as told by your health care provider.  Exercise regularly and spend time outdoors as told by your health care provider. General instructions  Take over-the-counter and prescription medicines only as told by your health care provider.  Do not drink alcohol. If you drink alcohol, limit your alcohol intake to no more than 1 drink a day for nonpregnant women and 2 drinks a day for men. One drink equals 12 oz of beer, 5 oz of wine, or 1 oz of hard liquor. Alcohol can affect any antidepressant medicines you are taking. Talk to your health care provider about your alcohol use.  Eat a healthy diet and get plenty of sleep.  Find activities that you enjoy doing, and make time to do them.  Consider joining a support group. Your health care provider may be able to recommend a support group.  Keep all follow-up visits as told by your health care provider. This is important. Where to find more information: The First Americanational Alliance on Mental Illness  www.nami.org  U.S. [employer] Millsational Institute of Mental Health  http://www.maynard.net/www.nimh.nih.gov  National Suicide Prevention  Lifeline  1-800-273-TALK 614-155-6675(8255). This is free, 24-hour help.  Contact a health care provider if:  Your symptoms get worse.  You develop new symptoms. Get help right away if:  You self-harm.  You have serious thoughts about hurting yourself or others.  You see, hear, taste, smell, or feel things that are not present (hallucinate). This information is not intended to replace advice given to you by your health care provider. Make sure you discuss any questions you have with your health care provider. Document Released: 07/18/2012 Document Revised: 11/28/2015 Document Reviewed: 10/02/2015 Elsevier Interactive Patient Education  2017 [employer]vier [employer].      WalgreenCommunity Resources  Advocacy/Legal Legal Aid [place]:  [phone]  /  [phone]  Family Justice Center:  [phone]  Family Service of the [facility] 24-hr Crisis line:  [phone]  [facility], [place]:  [phone]  Court Watch (custody):  [phone]  [employer] Humanitarian Law Clinic:   [phone]    Baby & Breastfeeding Car Seat Inspection @ Various [place] Equities traderire Depts.- call [phone]  [facility] Lactation  [phone]  [facility] Lactation [phone]  WIC: [phone] ([place]);  [phone] ([place])  ProvidenceLa Leche League:  [phone]   Childcare [place] Child Development: [phone] [facility]([place]) / [phone] ([place])  - Child Care Resources/ Referrals/ Scholarships  - Head Start/ Early Head Start (call or apply online)  [place] DHHS: [place] Pre-K :  [phone] / [phone]   Employment / Job Search [facility] of [place]: [phone] / [address]  [employer] (JobLink): [phone] ([place]) / [phone] (  [place])  Triad Engineer, materials Center: [phone] / [phone]  [facility] Job & Career Center: [phone]  DHHS Work First: [phone] ([place]) / [phone] ([place])  [facility] [place]:  303-410-2055   Financial  Assistance [employer] [facility]:  6151278393  Salvation Army: 769-381-7275  Dominica Severin Network (furniture):  (872) [phone]  [facility] Helping Hands: 850-076-0229  Low Income Energy Assistance  [phone]   Food Assistance DHHS- SNAP/ Food Stamps: 732-003-5308  WIC: Manley Mason902-887-0046 ;  [place] ([phone]  Layne Benton Book- Free Meals  Little Blue Book- Free Food Pantries  During the summer, text "FOOD" to [phone]   General Health / Clinics (Adults) Orange Card (for Adults) through [facility]: [phone]   Family Medicine:   [phone]  [facility] Health & Wellness:   [phone]  Health Department:  [phone]  [facility] Health:  [phone] / [phone]  Planned Parenthood of [place]:   [phone]  [facility]:   [phone]   Housing [place] Housing Coalition:   424-788-3421  [facility] Housing Authority:  (608)[phone]  Affordable Housing Managemnt:  737-839-7169   Immigrant/ Refugee Center for [facility]):  7372975961  Faith Action International House:  [phone]  [employer] Institute:  979-773-6452  Parks Ranger Services:  (580)[phone]  African Services Coalition:  905-126-6471   LGBTQ YouthSAFE  www.youthsafe[place].org  PFLAG  676-720-9470 / Charlies Silvers  The Beryle Beams Project:  [phone]   Mental Health/ Substance Use Family Service of the [facility]  [phone]  [facility] Behavioral Health:  ([phone] or 1-617-540-2356  [facility] of Care:  ([phone]  Journeys Counseling:  [phone]  [facility] Care Services:  ([phone]  Vesta Mixer (walk-ins)  850-841-5485 / [address]  [place]:  [phone]  Alcoholics Anonymous:  5067967178  Narcotics Anonymous:  316-593-4292  Quit Smoking Hotline:  800-QUIT-NOW 517-177-6062)   Parenting Children's Home Society:  [phone]  [facility] Health: Education Center & Support Groups:  450-104-2492   YWCA: 234-388-6121  UNCG: Bringing Out the Best:  262-175-6467               Thriving at Three (Hispanic families): (906)[phone]  Healthy Start (Family Service of the [place]):  928-313-1390 [phone]  Parents as Teachers:  3511934294  [place] Child Development- Learning Together (Immigrants): 909-877-3756   Poison Control 339-521-9262  Sports & Recreation [employer] Open Doors Application: https://www.rich.com/  [place] of [place] Recreation Centers: http://www.[place].gov/index.aspx?page=3615   Special Needs Family Support Network:  5633906267  Autism Society of [place]:   540-217-4871 [phone] or (210)[phone] /  3017634435  [facility]:  (971) [phone]  ARC of [place]:  570-387-6511  Children's Developmental Service Agency (CDSA):  (928)466-7478  [facility] (Care Coordination for Children):  424-649-8195   Transportation Medicaid Transportation: (613) 454-0681 to apply  Dallie Piles Authority: (986)314-0632 (reduced-fare bus ID to Medicaid/ Medicare/ Orange Card)  SCAT Paratransit services: Eligible riders only, call [phone] for application   Tutoring/Mentoring Black Child Development Institute: 570-252-0825  [facility] Brothers/ Big Sisters: (432)[phone] ([place])  ACES through child's school: (623)546-5829  [employer] Achievers: contact your local Y  SHIELD Mentor Program: (661)[phone]
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
## 2017-01-28 ENCOUNTER — Telehealth (INDEPENDENT_AMBULATORY_CARE_PROVIDER_SITE_OTHER): Payer: Self-pay

## 2017-01-28 LAB — TSH: TSH: 1.25 u[IU]/mL (ref 0.450–4.500)

## 2017-01-28 LAB — CBC WITH DIFFERENTIAL/PLATELET
BASOS ABS: 0 10*3/uL (ref 0.0–0.2)
Basos: 0 %
EOS (ABSOLUTE): 0.1 10*3/uL (ref 0.0–0.4)
Eos: 1 %
HEMOGLOBIN: 12.5 g/dL (ref 11.1–15.9)
Hematocrit: 37.6 % (ref 34.0–46.6)
Immature Grans (Abs): 0 10*3/uL (ref 0.0–0.1)
Immature Granulocytes: 0 %
LYMPHS ABS: 3.3 10*3/uL — AB (ref 0.7–3.1)
Lymphs: 39 %
MCH: 28.3 pg (ref 26.6–33.0)
MCHC: 33.2 g/dL (ref 31.5–35.7)
MCV: 85 fL (ref 79–97)
MONOCYTES: 6 %
Monocytes Absolute: 0.5 10*3/uL (ref 0.1–0.9)
NEUTROS PCT: 54 %
Neutrophils Absolute: 4.5 10*3/uL (ref 1.4–7.0)
Platelets: 344 10*3/uL (ref 150–379)
RBC: 4.42 x10E6/uL (ref 3.77–5.28)
RDW: 15.7 % — AB (ref 12.3–15.4)
WBC: 8.4 10*3/uL (ref 3.4–10.8)

## 2017-01-28 LAB — COMPREHENSIVE METABOLIC PANEL
ALK PHOS: 58 IU/L (ref 39–117)
ALT: 14 IU/L (ref 0–32)
AST: 13 IU/L (ref 0–40)
Albumin/Globulin Ratio: 1.8 (ref 1.2–2.2)
Albumin: 4.5 g/dL (ref 3.5–5.5)
BILIRUBIN TOTAL: 0.3 mg/dL (ref 0.0–1.2)
BUN / CREAT RATIO: 24 — AB (ref 9–23)
BUN: 23 mg/dL (ref 6–24)
CHLORIDE: 99 mmol/L (ref 96–106)
CO2: 28 mmol/L (ref 20–29)
Calcium: 9.8 mg/dL (ref 8.7–10.2)
Creatinine, Ser: 0.97 mg/dL (ref 0.57–1.00)
GFR calc Af Amer: 82 mL/min/{1.73_m2} (ref >59)
GFR calc non Af Amer: 71 mL/min/{1.73_m2} (ref >59)
GLUCOSE: 94 mg/dL (ref 65–99)
Globulin, Total: 2.5 g/dL (ref 1.5–4.5)
Potassium: 4.1 mmol/L (ref 3.5–5.2)
Sodium: 142 mmol/L (ref 134–144)
Total Protein: 7 g/dL (ref 6.0–8.5)

## 2017-01-28 NOTE — Telephone Encounter (Signed)
-----   Message from Loletta Specteroger David Gomez, PA-C sent at 01/28/2017  8:50 AM EDT ----- Labs normal/unremarkable.

## 2017-01-28 NOTE — Telephone Encounter (Signed)
Patient aware of normal lab results. Kelsey Hines, CMA  

## 2017-02-24 ENCOUNTER — Ambulatory Visit (INDEPENDENT_AMBULATORY_CARE_PROVIDER_SITE_OTHER): Payer: Self-pay | Admitting: Physician Assistant

## 2017-07-01 ENCOUNTER — Ambulatory Visit (HOSPITAL_COMMUNITY)
Admission: EM | Admit: 2017-07-01 | Discharge: 2017-07-01 | Disposition: A | Discharge status: Home / Self Care | Payer: Self-pay | Attending: Physician Assistant | Admitting: Physician Assistant

## 2017-07-01 ENCOUNTER — Encounter (HOSPITAL_COMMUNITY): Payer: Self-pay | Admitting: Emergency Medicine

## 2017-07-01 DIAGNOSIS — S61012A Laceration without foreign body of left thumb without damage to nail, initial encounter: Secondary | ICD-10-CM

## 2017-07-01 DIAGNOSIS — W269XXA Contact with unspecified sharp object(s), initial encounter: Secondary | ICD-10-CM

## 2017-07-01 DIAGNOSIS — Z23 Encounter for immunization: Secondary | ICD-10-CM

## 2017-07-01 MED ORDER — TETANUS-DIPHTH-ACELL PERTUSSIS 5-2.5-18.5 LF-MCG/0.5 IM SUSP
INTRAMUSCULAR | Status: AC
  Filled 2017-07-01: qty 0.5

## 2017-07-01 MED ORDER — LIDOCAINE HCL 2 % IJ SOLN
INTRAMUSCULAR | Status: AC
  Filled 2017-07-01: qty 20

## 2017-07-01 MED ORDER — TETANUS-DIPHTH-ACELL PERTUSSIS 5-2.5-18.5 LF-MCG/0.5 IM SUSP
0.5000 mL | Freq: Once | INTRAMUSCULAR | Status: AC
  Administered 2017-07-01: 0.5 mL via INTRAMUSCULAR

## 2017-07-01 NOTE — ED Triage Notes (Signed)
Pt states last night she was cutting in the kitchen and cut her L thumb. Bleeding controlled at this time. Pt irrigated wound yesterday. Bandaid applied at this time. Pt unsure of last tetanus.

## 2017-07-01 NOTE — Discharge Instructions (Signed)
Dermabond applied to your wound, this will fall off on own once wound is healed, usually about 7-10 days. Monitor for any spreading redness, increased warmth, fever, follow up for reevaluation.

## 2017-07-01 NOTE — ED Provider Notes (Signed)
[facility]    CSN: [id number] Arrival date & time: 07/01/17  1755     History   Chief Complaint Chief Complaint  Patient presents with  . Finger Injury  . Laceration    HPI Kelsey Hines is a 47 y.o. female.   47 year old female comes in for 1 day history of left thumb laceration.  States she cut her thumb in the kitchen last night. States cleaned out the wound and has been applying gauze to prevent bleeding.  Bleeding controlled with pressure.  Area continued to bleed and came in for evaluation.  She has full range of motion of her thumb, denies numbness tingling.  Unsure of last tetanus.      Past Medical History:  Diagnosis Date  . Ankylosis   . Arthritis   . Depression   . Herniated disc   . Hypertension   . Scoliosis (and kyphoscoliosis), idiopathic     Patient Active Problem List   Diagnosis Date Noted  . Closed fracture of left ulna 11/25/2015    Past Surgical History:  Procedure Laterality Date  . KNEE SURGERY    . SHOULDER ARTHROSCOPY      OB History   None      Home Medications    Prior to Admission medications   Medication Sig Start Date End Date Taking? Authorizing Provider  Cholecalciferol (VITAMIN D-3) 5000 units TABS Take 1 tablet by mouth daily. 01/27/17  Yes Loletta Specter, PA-C  escitalopram (LEXAPRO) 10 MG tablet Take 1 tablet (10 mg total) by mouth daily. 01/27/17  Yes Loletta Specter, PA-C  gabapentin (NEURONTIN) 300 MG capsule Take 1 capsule (300 mg total) by mouth 3 (three) times daily. Patient not taking: Reported on 07/01/2017 01/27/17   Loletta Specter, PA-C  predniSONE (DELTASONE) 20 MG tablet Two daily with food Patient not taking: Reported on 07/01/2017 01/07/17   Elvina Sidle, MD  vitamin B-12 (CYANOCOBALAMIN) 250 MCG tablet Take 1 tablet (250 mcg total) by mouth daily. 01/27/17   Loletta Specter, PA-C  zolpidem (AMBIEN) 5 MG tablet Take 1 tablet (5 mg total) by mouth at bedtime as needed for  sleep. Patient not taking: Reported on 07/01/2017 01/27/17   Loletta Specter, PA-C    Family History No family history on file.  Social History Social History   Tobacco Use  . Smoking status: Former Smoker    Packs/day: 0.00    Types: Cigarettes  . Smokeless tobacco: Never Used  . Tobacco comment: quit 2016  Substance Use Topics  . Alcohol use: No  . Drug use: No     Allergies   Avelox [moxifloxacin hcl in nacl]   Review of Systems Review of Systems  Reason unable to perform ROS: See HPI as above.     Physical Exam Triage Vital Signs ED Triage Vitals  Enc Vitals Group     BP 07/01/17 1814 (!) 156/84     Pulse Rate 07/01/17 1814 86     Resp 07/01/17 1814 18     Temp 07/01/17 1814 98.1 F (36.7 C)     Temp src --      SpO2 07/01/17 1814 100 %     Weight --      Height --      Head Circumference --      Peak Flow --      Pain Score 07/01/17 1816 0     Pain Loc --      Pain Edu? --  Excl. in GC? --    No data found.  Updated Vital Signs BP (!) 156/84   Pulse 86   Temp 98.1 F (36.7 C)   Resp 18   LMP 09/25/2013   SpO2 100%   Visual Acuity Right Eye Distance:   Left Eye Distance:   Bilateral Distance:    Right Eye Near:   Left Eye Near:    Bilateral Near:     Physical Exam  Constitutional: She is oriented to person, place, and time. She appears well-developed and well-nourished. No distress.  HENT:  Head: Normocephalic and atraumatic.  Eyes: Pupils are equal, round, and reactive to light. Conjunctivae are normal.  Musculoskeletal:  V shaped laceration to the the lateral side of the thumb. Around 1cm in total length. Bleeding controlled. Full ROM of thumb without gaping of the laceration. Sensation intact. Cap refill <2s  Neurological: She is alert and oriented to person, place, and time.     UC Treatments / Results  Labs (all labs ordered are listed, but only abnormal results are displayed) Labs Reviewed - No data to  display  EKG None Radiology No results found.  Procedures Laceration Repair Date/Time: 07/01/2017 8:03 PM Performed by: Belinda FisherYu, Liberato Stansbery V, PA-C Authorized by: Belinda FisherYu, Yasmene Salomone V, PA-C   Consent:    Consent obtained:  Verbal   Consent given by:  Patient   Risks discussed:  Infection, pain, poor cosmetic result and poor wound healing   Alternatives discussed:  No treatment Anesthesia (see MAR for exact dosages):    Anesthesia method:  Nerve block   Block location:  Thumb   Block needle gauge:  27 G   Block anesthetic:  Lidocaine 2% w/o epi   Block outcome:  Anesthesia achieved Laceration details:    Location:  Finger   Finger location:  L thumb   Length (cm):  1   Depth (mm):  1 Repair type:    Repair type:  Simple Exploration:    Hemostasis achieved with:  Direct pressure   Wound exploration: wound explored through full range of motion and entire depth of wound probed and visualized   Treatment:    Area cleansed with:  Betadine and saline   Amount of cleaning:  Standard   Irrigation solution:  Sterile saline   Irrigation method:  Pressure wash Skin repair:    Repair method:  Tissue adhesive Approximation:    Approximation:  Close Post-procedure details:    Dressing:  Open (no dressing)   Patient tolerance of procedure:  Tolerated well, no immediate complications   (including critical care time)  Medications Ordered in UC Medications  Tdap (BOOSTRIX) injection 0.5 mL (0.5 mLs Intramuscular Given 07/01/17 2002)     Initial Impression / Assessment and Plan / UC Course  I have reviewed the triage vital signs and the nursing notes.  Pertinent labs & imaging results that were available during my care of the patient were reviewed by me and considered in my medical decision making (see chart for details).    Patient tolerated procedure well.  Tetanus updated today.  Wound care instructions given.  Return precautions given.  Patient expresses understanding and agrees to plan.  Final  Clinical Impressions(s) / UC Diagnoses   Final diagnoses:  Laceration of left thumb without foreign body without damage to nail, initial encounter    ED Discharge Orders    None        Lurline IdolYu, Natividad Schlosser V, PA-C 07/01/17 2005

## 2017-07-21 ENCOUNTER — Encounter (HOSPITAL_COMMUNITY): Payer: Self-pay | Admitting: Emergency Medicine

## 2017-07-21 ENCOUNTER — Ambulatory Visit (HOSPITAL_COMMUNITY)
Admission: EM | Admit: 2017-07-21 | Discharge: 2017-07-21 | Disposition: A | Discharge status: Home / Self Care | Payer: Self-pay | Attending: Family Medicine | Admitting: Family Medicine

## 2017-07-21 ENCOUNTER — Other Ambulatory Visit: Payer: Self-pay

## 2017-07-21 DIAGNOSIS — M542 Cervicalgia: Secondary | ICD-10-CM

## 2017-07-21 DIAGNOSIS — M25512 Pain in left shoulder: Secondary | ICD-10-CM

## 2017-07-21 DIAGNOSIS — M5489 Other dorsalgia: Secondary | ICD-10-CM

## 2017-07-21 DIAGNOSIS — M25511 Pain in right shoulder: Secondary | ICD-10-CM

## 2017-07-21 MED ORDER — CYCLOBENZAPRINE HCL 5 MG PO TABS: 5.0000 mg | ORAL_TABLET | Freq: Every evening | ORAL | 0 refills | Status: DC | PRN

## 2017-07-21 MED ORDER — MELOXICAM 7.5 MG PO TABS: 7.5000 mg | ORAL_TABLET | Freq: Every day | ORAL | 0 refills | Status: DC
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
## 2017-07-21 NOTE — ED Notes (Signed)
Triage incomplete, patient answered her phone during in-take
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
## 2017-10-22 ENCOUNTER — Emergency Department (HOSPITAL_COMMUNITY): Payer: Managed Care, Other (non HMO)

## 2017-10-22 ENCOUNTER — Emergency Department (HOSPITAL_COMMUNITY)
Admission: EM | Admit: 2017-10-22 | Discharge: 2017-10-23 | Disposition: A | Discharge status: Home / Self Care | Payer: Managed Care, Other (non HMO) | Attending: Emergency Medicine | Admitting: Emergency Medicine

## 2017-10-22 ENCOUNTER — Other Ambulatory Visit: Payer: Self-pay

## 2017-10-22 DIAGNOSIS — F329 Major depressive disorder, single episode, unspecified: Secondary | ICD-10-CM | POA: Diagnosis present

## 2017-10-22 DIAGNOSIS — Z87891 Personal history of nicotine dependence: Secondary | ICD-10-CM | POA: Insufficient documentation

## 2017-10-22 DIAGNOSIS — Z008 Encounter for other general examination: Secondary | ICD-10-CM | POA: Insufficient documentation

## 2017-10-22 DIAGNOSIS — F332 Major depressive disorder, recurrent severe without psychotic features: Secondary | ICD-10-CM | POA: Insufficient documentation

## 2017-10-22 DIAGNOSIS — M545 Low back pain, unspecified: Secondary | ICD-10-CM

## 2017-10-22 DIAGNOSIS — I10 Essential (primary) hypertension: Secondary | ICD-10-CM | POA: Diagnosis not present

## 2017-10-22 DIAGNOSIS — Z79899 Other long term (current) drug therapy: Secondary | ICD-10-CM | POA: Insufficient documentation

## 2017-10-22 DIAGNOSIS — R4586 Emotional lability: Secondary | ICD-10-CM

## 2017-10-22 LAB — COMPREHENSIVE METABOLIC PANEL
ALBUMIN: 4 g/dL (ref 3.5–5.0)
ALT: 25 U/L (ref 0–44)
AST: 23 U/L (ref 15–41)
Alkaline Phosphatase: 60 U/L (ref 38–126)
Anion gap: 7 (ref 5–15)
BILIRUBIN TOTAL: 0.4 mg/dL (ref 0.3–1.2)
BUN: 18 mg/dL (ref 6–20)
CO2: 28 mmol/L (ref 22–32)
Calcium: 9.5 mg/dL (ref 8.9–10.3)
Chloride: 104 mmol/L (ref 98–111)
Creatinine, Ser: 0.99 mg/dL (ref 0.44–1.00)
GFR calc Af Amer: >60 mL/min (ref >60)
GFR calc non Af Amer: >60 mL/min (ref >60)
GLUCOSE: 96 mg/dL (ref 70–99)
POTASSIUM: 4.1 mmol/L (ref 3.5–5.1)
SODIUM: 139 mmol/L (ref 135–145)
TOTAL PROTEIN: 7.4 g/dL (ref 6.5–8.1)

## 2017-10-22 LAB — CBC WITH DIFFERENTIAL/PLATELET
BASOS ABS: 0 10*3/uL (ref 0.0–0.1)
BASOS PCT: 0 %
EOS ABS: 0.1 10*3/uL (ref 0.0–0.7)
EOS PCT: 2 %
HCT: 37.7 % (ref 36.0–46.0)
Hemoglobin: 12.5 g/dL (ref 12.0–15.0)
LYMPHS PCT: 30 %
Lymphs Abs: 2.7 10*3/uL (ref 0.7–4.0)
MCH: 29.6 pg (ref 26.0–34.0)
MCHC: 33.2 g/dL (ref 30.0–36.0)
MCV: 89.1 fL (ref 78.0–100.0)
MONO ABS: 0.6 10*3/uL (ref 0.1–1.0)
Monocytes Relative: 6 %
Neutro Abs: 5.7 10*3/uL (ref 1.7–7.7)
Neutrophils Relative %: 62 %
PLATELETS: 363 10*3/uL (ref 150–400)
RBC: 4.23 MIL/uL (ref 3.87–5.11)
RDW: 15.9 % — AB (ref 11.5–15.5)
WBC: 9.2 10*3/uL (ref 4.0–10.5)

## 2017-10-22 LAB — URINALYSIS, ROUTINE W REFLEX MICROSCOPIC
Bilirubin Urine: NEGATIVE
GLUCOSE, UA: NEGATIVE mg/dL
HGB URINE DIPSTICK: NEGATIVE
KETONES UR: NEGATIVE mg/dL
LEUKOCYTES UA: NEGATIVE
Nitrite: NEGATIVE
PH: 6 (ref 5.0–8.0)
PROTEIN: NEGATIVE mg/dL
Specific Gravity, Urine: 1.02 (ref 1.005–1.030)

## 2017-10-22 LAB — I-STAT BETA HCG BLOOD, ED (MC, WL, AP ONLY)

## 2017-10-22 LAB — RAPID URINE DRUG SCREEN, HOSP PERFORMED
Amphetamines: NOT DETECTED
BARBITURATES: NOT DETECTED
BENZODIAZEPINES: NOT DETECTED
COCAINE: NOT DETECTED
Opiates: NOT DETECTED
TETRAHYDROCANNABINOL: NOT DETECTED

## 2017-10-22 LAB — SALICYLATE LEVEL

## 2017-10-22 LAB — ETHANOL: Alcohol, Ethyl (B): <10 mg/dL (ref <10)

## 2017-10-22 LAB — ACETAMINOPHEN LEVEL

## 2017-10-22 MED ORDER — KETOROLAC TROMETHAMINE 30 MG/ML IJ SOLN
30.0000 mg | Freq: Once | INTRAMUSCULAR | Status: AC
  Administered 2017-10-22: 30 mg via INTRAMUSCULAR
  Filled 2017-10-22: qty 1

## 2017-10-22 MED ORDER — ESCITALOPRAM OXALATE 10 MG PO TABS: 20.0000 mg | ORAL_TABLET | Freq: Every day | ORAL | Status: DC

## 2017-10-22 MED ORDER — ACETAMINOPHEN 500 MG PO TABS: 1000.0000 mg | ORAL_TABLET | Freq: Two times a day (BID) | ORAL | Status: DC | PRN

## 2017-10-22 MED ORDER — METHOCARBAMOL 500 MG PO TABS
750.0000 mg | ORAL_TABLET | Freq: Once | ORAL | Status: AC
  Administered 2017-10-22: 750 mg via ORAL
  Filled 2017-10-22: qty 2

## 2017-10-22 NOTE — ED Provider Notes (Signed)
[facility] Provider Note   CSN: [id number] Arrival date & time: 10/22/17  1842     History   Chief Complaint Chief Complaint  Patient presents with  . Back Pain  . Leg Pain  . Depression    HPI Kelsey Hines is a 47 y.o. female h/o depression on lexapro here for evaluation of "feeling like i'm going to snap".  Onset 2 days ago.  Has not gotten out of bed, has been crying intermittently, snapping at her kids and yelling at them, having difficulty sleeping. Has been drinking beer to help.  Has had thoughts of her kids being better of if she wasn't around. No active SI, HI, AVH.  Recent stress at work.  Reports pain in her low back, legs, feet, arms, described as "soreness".  Associated with tingling throughout. States she used to be on a lot of medicine for fibromyalgia but she stopped taking anything and now only taking BC powders and ibuprofen as needed.  Reports low back pain is new, severe, intermittent, in the middle. No falls, trauma, groin numbness, loss of sensation or weakness to extremities, dysuria, hematuria, changes to BM.    No fevers, chills, CP, SOB, cough, abdominal pain, dysuria, hematuria.   HPI  Past Medical History:  Diagnosis Date  . Ankylosis   . Arthritis   . Depression   . Herniated disc   . Hypertension   . Scoliosis (and kyphoscoliosis), idiopathic     Patient Active Problem List   Diagnosis Date Noted  . Closed fracture of left ulna 11/25/2015    Past Surgical History:  Procedure Laterality Date  . KNEE SURGERY    . SHOULDER ARTHROSCOPY       OB History   None      Home Medications    Prior to Admission medications   Medication Sig Start Date End Date Taking? Authorizing Provider  acetaminophen (TYLENOL) 500 MG tablet Take 1,000 mg by mouth every 12 (twelve) hours as needed for moderate pain.   Yes [provider]  escitalopram (LEXAPRO) 20 MG tablet Take 20 mg by mouth daily.   Yes  [provider]  ibuprofen (ADVIL,MOTRIN) 200 MG tablet Take 800 mg by mouth every 12 (twelve) hours as needed for moderate pain.   Yes [provider]  VITAMIN E PO Take 1 tablet by mouth daily.   Yes [provider]  Cholecalciferol (VITAMIN D-3) 5000 units TABS Take 1 tablet by mouth daily. Patient not taking: Reported on 10/22/2017 01/27/17   Loletta SpecterGomez, Roger David, PA-C  cyclobenzaprine (FLEXERIL) 5 MG tablet Take 1 tablet (5 mg total) by mouth at bedtime as needed for muscle spasms. Patient not taking: Reported on 10/22/2017 07/21/17   Belinda FisherYu, Amy V, PA-C  escitalopram (LEXAPRO) 10 MG tablet Take 1 tablet (10 mg total) by mouth daily. Patient not taking: Reported on 10/22/2017 01/27/17   Loletta SpecterGomez, Roger David, PA-C  gabapentin (NEURONTIN) 300 MG capsule Take 1 capsule (300 mg total) by mouth 3 (three) times daily. Patient not taking: Reported on 07/01/2017 01/27/17   Loletta SpecterGomez, Roger David, PA-C  meloxicam (MOBIC) 7.5 MG tablet Take 1 tablet (7.5 mg total) by mouth daily. Patient not taking: Reported on 10/22/2017 07/21/17   Belinda FisherYu, Amy V, PA-C  predniSONE (DELTASONE) 20 MG tablet Two daily with food Patient not taking: Reported on 07/01/2017 01/07/17   Elvina SidleLauenstein, Kurt, MD  vitamin B-12 (CYANOCOBALAMIN) 250 MCG tablet Take 1 tablet (250 mcg total) by mouth daily. Patient  not taking: Reported on 10/22/2017 01/27/17   Loletta Specter, PA-C  zolpidem (AMBIEN) 5 MG tablet Take 1 tablet (5 mg total) by mouth at bedtime as needed for sleep. Patient not taking: Reported on 07/01/2017 01/27/17   Loletta Specter, PA-C    Family History Family History  Problem Relation Age of Onset  . Hypertension Mother   . Hypertension Father     Social History Social History   Tobacco Use  . Smoking status: Former Smoker    Packs/day: 0.00    Types: Cigarettes  . Smokeless tobacco: Never Used  . Tobacco comment: quit 2016  Substance Use Topics  . Alcohol use: No  . Drug use: No      Allergies   Avelox [moxifloxacin hcl in nacl]   Review of Systems Review of Systems  Musculoskeletal: Positive for back pain.  Neurological:       Paresthesias to entire body   Psychiatric/Behavioral: Positive for dysphoric mood and sleep disturbance.  All other systems reviewed and are negative.    Physical Exam Updated Vital Signs BP (!) 162/94 (BP Location: Left Arm)   Pulse 99   Temp 98.6 F (37 C) (Oral)   Resp 18   LMP 09/25/2013   SpO2 98%   Physical Exam  Constitutional: She is oriented to person, place, and time. She appears well-developed and well-nourished. No distress.  Non toxic  HENT:  Head: Normocephalic and atraumatic.  Nose: Nose normal.  Eyes: Pupils are equal, round, and reactive to light. Conjunctivae and EOM are normal.  Neck: Normal range of motion.  Cardiovascular: Normal rate, regular rhythm and normal heart sounds.  Pulmonary/Chest: Effort normal and breath sounds normal.  Abdominal: Soft. Bowel sounds are normal. There is no tenderness.  No G/R/R. No suprapubic or CVA tenderness. Negative Murphy's and McBurney's.   Musculoskeletal: Normal range of motion. She exhibits tenderness.  T spine: no midline tenderness.  L spine: low midline tenderness. Bilateral paraspinal muscle tenderness.  Sits up in bed without assistance or obvious discomfort.   Neurological: She is alert and oriented to person, place, and time.  Skin: Skin is warm and dry. Capillary refill takes less than 2 seconds.  Psychiatric: Her affect is labile. She expresses inappropriate judgment.  Pt is labile.  Initially appears withdrawn but quickly starts to cry when providing history.  Crying abruptly stops and starts swearing intermittently describing her back pain. Odd affect.   Nursing note and vitals reviewed.    ED Treatments / Results  Labs (all labs ordered are listed, but only abnormal results are displayed) Labs Reviewed  CBC WITH DIFFERENTIAL/PLATELET -  Abnormal; Notable for the following components:      Result Value   RDW 15.9 (*)    All other components within normal limits  URINALYSIS, ROUTINE W REFLEX MICROSCOPIC - Abnormal; Notable for the following components:   APPearance HAZY (*)    All other components within normal limits  RAPID URINE DRUG SCREEN, HOSP PERFORMED  COMPREHENSIVE METABOLIC PANEL  ETHANOL  ACETAMINOPHEN LEVEL  SALICYLATE LEVEL  I-STAT BETA HCG BLOOD, ED ([facility], [facility], [facility] ONLY)    EKG None  Radiology Dg Lumbar Spine Complete  Result Date: 10/22/2017 CLINICAL DATA:  Low back pain x2 days after sneezing. EXAM: LUMBAR SPINE - COMPLETE 4+ VIEW COMPARISON:  None. FINDINGS: Five non ribbed lumbar vertebrae. No pars defects or listhesis. No acute fracture or suspicious osseous lesions. Disc spaces are maintained without significant flattening. Slight right-side-down pelvic tilt which  may be positional. IMPRESSION: No significant disc flattening or acute osseous abnormality of the lumbar spine. Electronically Signed   By: Tollie Eth M.D.   On: 10/22/2017 22:01    Procedures Procedures (including critical care time)  Medications Ordered in ED Medications  escitalopram (LEXAPRO) tablet 20 mg (has no administration in time range)  acetaminophen (TYLENOL) tablet 1,000 mg (has no administration in time range)  ketorolac (TORADOL) 30 MG/ML injection 30 mg (30 mg Intramuscular Given 10/22/17 2151)  methocarbamol (ROBAXIN) tablet 750 mg (750 mg Oral Given 10/22/17 2151)     Initial Impression / Assessment and Plan / ED Course  I have reviewed the triage vital signs and the nursing notes.  Pertinent labs & imaging results that were available during my care of the patient were reviewed by me and considered in my medical decision making (see chart for details).     47 year old female here with labile mood, increased crying, snapping at her family, difficulty sleeping.  In setting of depression currently compliant with  Lexapro.  Known stressors at home.  Has been drinking EtOH to help with symptoms.  On exam, she is labile.  Flat affect initially but suddenly starts crying.  Also gets very worked up when discussing her back pain and starts wearing.  Then quickly becomes more sad and withdrawn, understanding and cooperative.  She has history of fibromyalgia and is also endorsing pain to her bilateral legs, upper back, arms and tingling to her toes.  Musculoskeletal exam reveals low midline lumbar and paraspinal muscle tenderness.  X-ray ordered negative.  She has no saddle anesthesia, bladder or bowel incontinence or retention, loss of sensation or weakness or extremities to suggest cauda equina.  Doubt epidural abscess.  She has no urinary symptoms, suprapubic or CVA tenderness.  Doubt dissection.  Will obtain screening labs for medical clearance.  I do think she will benefit from formal TTS evaluation given her labile mood.  She is agreeable to stay voluntarily.   2310: Pending CMP, ETOH, APAP and salicylate levels.  TTS consult pending.  Will hand off to oncoming EDPA who will f/u on remaining labs, if normal pt is medically cleared and pt may be discharged per psych recommendations. Home meds ordered.  Final Clinical Impressions(s) / ED Diagnoses   Final diagnoses:  Labile mood  Acute bilateral low back pain without sciatica    ED Discharge Orders    None       Jerrell Mylar 10/22/17 2314    Benjiman Core, MD 10/23/17 [phone]

## 2017-10-22 NOTE — ED Notes (Signed)
Bed: WA30 Expected date:  Expected time:  Means of arrival:  Comments: 

## 2017-10-22 NOTE — ED Triage Notes (Signed)
Pt reports she has been having back and leg pain and she has a hx of fibromyalgia.  Pt also is tearful and reports she is having severe depression and stress. Pt reports she just has too much to handle.  Pt denies HI and SI, but reports she is "close to the edge"

## 2017-10-23 MED ORDER — HYDROCHLOROTHIAZIDE 12.5 MG PO TABS: 12.5000 mg | ORAL_TABLET | Freq: Every day | ORAL | 1 refills | Status: DC

## 2017-10-23 NOTE — ED Provider Notes (Signed)
Discussed with TTS.  Patient does not meet inpatient criteria but does need intensive outpatient resources.  I discussed this with the patient.  She reports she knows that she needs to "get her life back on track."  She request a few days off from work to attempt to set up appointments.  She confirms that she is not suicidal at this time.  BP (!) 172/105 (BP Location: Right Arm)   Pulse 74   Temp 98 F (36.7 C) (Oral)   Resp 20   LMP 09/25/2013   SpO2 99%    Patient is noted to be hypertensive here in the emergency department.  She reports she has a history of same but has not been on medications for a long time.  She has no headache, vision changes, chest pain or shortness of breath.  No elevation in serum creatinine or evidence of endorgan damage.  I discussed the importance of following up with her primary care provider.  Patient does not currently have a primary care provider.  She reports she has taken HCTZ in the past.  I will give a short prescription for this with the caveat that she will see a primary care provider within several weeks.  Patient states understanding and is in agreement with this plan.  Labile mood  Acute bilateral low back pain without sciatica  Essential hypertension     Anjali Manzella, Boyd KerbsHannah, PA-C 10/23/17 0355    Palumbo, April, MD 10/23/17 [phone]

## 2017-10-23 NOTE — Discharge Instructions (Signed)
1. Medications: HCTZ, usual home medications 2. Treatment: rest, drink plenty of fluids,  3. Follow Up: Please followup with your primary doctor in 3-5 days for discussion of your diagnoses and further evaluation after today's visit; if you do not have a primary care doctor use the resource guide provided to find one; Please return to the ER for headaches, vision changes, chest pain, shortness of breath, worsening symptoms.
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
## 2017-12-12 ENCOUNTER — Other Ambulatory Visit: Payer: Self-pay

## 2017-12-12 ENCOUNTER — Encounter (HOSPITAL_COMMUNITY): Payer: Self-pay

## 2017-12-12 ENCOUNTER — Emergency Department (HOSPITAL_COMMUNITY)
Admission: EM | Admit: 2017-12-12 | Discharge: 2017-12-12 | Disposition: A | Discharge status: Home / Self Care | Payer: Managed Care, Other (non HMO) | Attending: Emergency Medicine | Admitting: Emergency Medicine

## 2017-12-12 DIAGNOSIS — S40861A Insect bite (nonvenomous) of right upper arm, initial encounter: Secondary | ICD-10-CM | POA: Diagnosis not present

## 2017-12-12 DIAGNOSIS — I10 Essential (primary) hypertension: Secondary | ICD-10-CM | POA: Insufficient documentation

## 2017-12-12 DIAGNOSIS — Z87891 Personal history of nicotine dependence: Secondary | ICD-10-CM | POA: Diagnosis not present

## 2017-12-12 DIAGNOSIS — Z79899 Other long term (current) drug therapy: Secondary | ICD-10-CM | POA: Insufficient documentation

## 2017-12-12 DIAGNOSIS — Y999 Unspecified external cause status: Secondary | ICD-10-CM | POA: Diagnosis not present

## 2017-12-12 DIAGNOSIS — Y929 Unspecified place or not applicable: Secondary | ICD-10-CM | POA: Diagnosis not present

## 2017-12-12 DIAGNOSIS — W57XXXA Bitten or stung by nonvenomous insect and other nonvenomous arthropods, initial encounter: Secondary | ICD-10-CM | POA: Insufficient documentation

## 2017-12-12 DIAGNOSIS — S40862A Insect bite (nonvenomous) of left upper arm, initial encounter: Secondary | ICD-10-CM

## 2017-12-12 DIAGNOSIS — Y939 Activity, unspecified: Secondary | ICD-10-CM | POA: Diagnosis not present

## 2017-12-12 DIAGNOSIS — S4991XA Unspecified injury of right shoulder and upper arm, initial encounter: Secondary | ICD-10-CM | POA: Diagnosis present

## 2017-12-12 MED ORDER — CEPHALEXIN 500 MG PO CAPS: ORAL_CAPSULE | ORAL | 0 refills | Status: DC

## 2017-12-12 NOTE — ED Notes (Signed)
Called for triage x1.

## 2017-12-12 NOTE — ED Provider Notes (Signed)
[facility]-EMERGENCY DEPT Provider Note   CSN: [id number] Arrival date & time: 12/12/17  2003     History   Chief Complaint Chief Complaint  Patient presents with  . Insect Bite    HPI Kelsey Hines is a 47 y.o. female.  The history is provided by the patient. No language interpreter was used.    47 year old female presenting complaining of mosquito bites.  Patient mentions she was outside 2 days ago and was bitten multiple times by mosquitoes.  States she counts upper tooth 13 different bites on her arms and legs.  Since then she noticed the biceps increased in size and become warm to the touch and painful which concerns her.  She denies any associated fever, throat swelling, abdominal cramping, chest pain or trouble breathing.  She does complain of itchiness to the affected sites.  She is concerned for infection.  She tries taking Zyrtec and hydrocortisone with some improvement.  Past Medical History:  Diagnosis Date  . Ankylosis   . Arthritis   . Depression   . Herniated disc   . Hypertension   . Scoliosis (and kyphoscoliosis), idiopathic     Patient Active Problem List   Diagnosis Date Noted  . Closed fracture of left ulna 11/25/2015    Past Surgical History:  Procedure Laterality Date  . KNEE SURGERY    . SHOULDER ARTHROSCOPY       OB History   None      Home Medications    Prior to Admission medications   Medication Sig Start Date End Date Taking? Authorizing Provider  acetaminophen (TYLENOL) 500 MG tablet Take 1,000 mg by mouth every 12 (twelve) hours as needed for moderate pain.    [provider]  Cholecalciferol (VITAMIN D-3) 5000 units TABS Take 1 tablet by mouth daily. Patient not taking: Reported on 10/22/2017 01/27/17   Loletta Specter, PA-C  cyclobenzaprine (FLEXERIL) 5 MG tablet Take 1 tablet (5 mg total) by mouth at bedtime as needed for muscle spasms. Patient not taking: Reported on 10/22/2017 07/21/17   Belinda Fisher, PA-C  escitalopram (LEXAPRO) 10 MG tablet Take 1 tablet (10 mg total) by mouth daily. Patient not taking: Reported on 10/22/2017 01/27/17   Loletta Specter, PA-C  escitalopram (LEXAPRO) 20 MG tablet Take 20 mg by mouth daily.    [provider]  gabapentin (NEURONTIN) 300 MG capsule Take 1 capsule (300 mg total) by mouth 3 (three) times daily. Patient not taking: Reported on 07/01/2017 01/27/17   Loletta Specter, PA-C  hydrochlorothiazide (HYDRODIURIL) 12.5 MG tablet Take 1 tablet (12.5 mg total) by mouth daily. 10/23/17   Muthersbaugh, Dahlia Client, PA-C  ibuprofen (ADVIL,MOTRIN) 200 MG tablet Take 800 mg by mouth every 12 (twelve) hours as needed for moderate pain.    [provider]  meloxicam (MOBIC) 7.5 MG tablet Take 1 tablet (7.5 mg total) by mouth daily. Patient not taking: Reported on 10/22/2017 07/21/17   Belinda Fisher, PA-C  predniSONE (DELTASONE) 20 MG tablet Two daily with food Patient not taking: Reported on 07/01/2017 01/07/17   Elvina Sidle, MD  vitamin B-12 (CYANOCOBALAMIN) 250 MCG tablet Take 1 tablet (250 mcg total) by mouth daily. Patient not taking: Reported on 10/22/2017 01/27/17   Loletta Specter, PA-C  VITAMIN E PO Take 1 tablet by mouth daily.    [provider]  zolpidem (AMBIEN) 5 MG tablet Take 1 tablet (5 mg total) by mouth at bedtime as needed for sleep.  Patient not taking: Reported on 07/01/2017 01/27/17   Loletta Specter, PA-C    Family History Family History  Problem Relation Age of Onset  . Hypertension Mother   . Hypertension Father     Social History Social History   Tobacco Use  . Smoking status: Former Smoker    Packs/day: 0.00    Types: Cigarettes  . Smokeless tobacco: Never Used  . Tobacco comment: quit 2016  Substance Use Topics  . Alcohol use: No  . Drug use: No     Allergies   Avelox [moxifloxacin hcl in nacl]   Review of Systems Review of Systems  Constitutional: Negative for fever.  Skin:  Positive for rash.     Physical Exam Updated Vital Signs BP (!) 154/94 (BP Location: Left Arm)   Pulse 95   Temp 98.4 F (36.9 C) (Oral)   Resp 18   LMP 09/25/2013   SpO2 100%   Physical Exam  Constitutional: She appears well-developed and well-nourished. No distress.  HENT:  Head: Atraumatic.  Mouth/Throat: Oropharynx is clear and moist.  Eyes: Conjunctivae are normal.  Neck: Neck supple.  Cardiovascular: Normal rate and regular rhythm.  Pulmonary/Chest: Effort normal and breath sounds normal.  Abdominal: Soft. There is no tenderness.  Neurological: She is alert.  Skin: Rash (Multiple patchy erythematous rash noted throughout bilateral upper and lower arm consistent with skin reaction from bites) noted.  Psychiatric: She has a normal mood and affect.  Nursing note and vitals reviewed.    ED Treatments / Results  Labs (all labs ordered are listed, but only abnormal results are displayed) Labs Reviewed - No data to display  EKG None  Radiology No results found.  Procedures Procedures (including critical care time)  Medications Ordered in ED Medications - No data to display   Initial Impression / Assessment and Plan / ED Course  I have reviewed the triage vital signs and the nursing notes.  Pertinent labs & imaging results that were available during my care of the patient were reviewed by me and considered in my medical decision making (see chart for details).     BP (!) 154/94 (BP Location: Left Arm)   Pulse 95   Temp 98.4 F (36.9 C) (Oral)   Resp 18   LMP 09/25/2013   SpO2 100%    Final Clinical Impressions(s) / ED Diagnoses   Final diagnoses:  Insect bite of right upper extremity, initial encounter  Insect bite of arm, left, initial encounter    ED Discharge Orders    None     9:59 PM Patient here with multiple mosquito bites and she was voicing concerning for cellulitis of skin infection.  So far it appears to be more likely to be  localized skin irritation as opposed to infection however I did provide antibiotic, Keflex encourage patient to watchfully wait and only use antibiotic if symptoms worsen.  Patient voiced understanding.  She does not have any systemic manifestation.  No evidence of allergic reaction.   Fayrene Helper, PA-C 12/12/17 2200    Pricilla Loveless, MD 12/12/17 [phone]

## 2017-12-12 NOTE — ED Triage Notes (Signed)
Pt reports mosquito bites that occurred on Friday. She reports that they are red, swollen and itchy. Arms do appear irritated. No SOB. A&Ox4. Ambulatory.

## 2017-12-12 NOTE — ED Notes (Signed)
Pt called x 3  No answer. 

## 2017-12-12 NOTE — ED Notes (Signed)
Pt called x2. Not in lobby.  

## 2017-12-16 ENCOUNTER — Other Ambulatory Visit: Payer: Self-pay

## 2017-12-16 ENCOUNTER — Encounter (HOSPITAL_COMMUNITY): Payer: Self-pay | Admitting: Emergency Medicine

## 2017-12-16 ENCOUNTER — Telehealth (HOSPITAL_COMMUNITY): Payer: Self-pay | Admitting: Emergency Medicine

## 2017-12-16 ENCOUNTER — Ambulatory Visit (HOSPITAL_COMMUNITY)
Admission: EM | Admit: 2017-12-16 | Discharge: 2017-12-16 | Disposition: A | Discharge status: Home / Self Care | Payer: Managed Care, Other (non HMO)

## 2017-12-16 DIAGNOSIS — A6009 Herpesviral infection of other urogenital tract: Secondary | ICD-10-CM

## 2017-12-16 MED ORDER — VALACYCLOVIR HCL 1 G PO TABS: 1000.0000 mg | ORAL_TABLET | Freq: Two times a day (BID) | ORAL | 0 refills | Status: DC

## 2017-12-16 MED ORDER — VALACYCLOVIR HCL 1 G PO TABS: 1000.0000 mg | ORAL_TABLET | Freq: Two times a day (BID) | ORAL | 0 refills | Status: AC

## 2017-12-16 NOTE — ED Triage Notes (Addendum)
Reaching to grab something with left arm.  Patient felt "spider like" feeling in left arm. Patient reports incident occurred in July 2019.  Patient says she needs to go back to work.  Patient says arm is feeling better.    Patient mentions a bump in vaginal area.    Patient seemed vague with conversation.  Not sure if there is something else patient is wanting.

## 2017-12-16 NOTE — ED Provider Notes (Signed)
[facility]    CSN: [id number] Arrival date & time: 12/16/17  1915     History   Chief Complaint Chief Complaint  Patient presents with  . Arm Pain    HPI Kelsey Hines is a 47 y.o. female.   Subjective:   Kelsey Hines is a 47 y.o. female with a history of herpes that presents for evaluation of genital sores. Symptoms have been present for several days. She reports a single genital lesion consistent with her typical herpes outbreak. Last outbreak was about 6 months ago. She was originally diagnosed with herpes about 17 years ago. Patient reports that the lesion is painful. Denies any drainage. No vaginal discharge, odor, dysuria, fever, flank pain, back pain, hematuria, lower abdominal pain, nausea or vulvar itching.   Notably, patient reports that she injured her left arm while at work back in July 2019.  She has undergone a complete Workmen's Comp. medical evaluation but reports that the claim was subsequently denied.  Patient states that her left arm is okay and denies any pain but is requesting clearance to return to work.  The following portions of the patient's history were reviewed and updated as appropriate: allergies, current medications, past family history, past medical history, past social history, past surgical history and problem list.         Past Medical History:  Diagnosis Date  . Ankylosis   . Arthritis   . Depression   . Herniated disc   . Hypertension   . Scoliosis (and kyphoscoliosis), idiopathic     Patient Active Problem List   Diagnosis Date Noted  . Closed fracture of left ulna 11/25/2015    Past Surgical History:  Procedure Laterality Date  . KNEE SURGERY    . SHOULDER ARTHROSCOPY      OB History   None      Home Medications    Prior to Admission medications   Medication Sig Start Date End Date Taking? Authorizing Provider  buPROPion (WELLBUTRIN XL) 300 MG 24 hr tablet Take 300 mg by mouth daily.   Yes  [provider]  gabapentin (NEURONTIN) 300 MG capsule Take 300 mg by mouth 3 (three) times daily.   Yes [provider]  acetaminophen (TYLENOL) 500 MG tablet Take 1,000 mg by mouth every 12 (twelve) hours as needed for moderate pain.    [provider]  ibuprofen (ADVIL,MOTRIN) 200 MG tablet Take 800 mg by mouth every 12 (twelve) hours as needed for moderate pain.    [provider]  valACYclovir (VALTREX) 1000 MG tablet Take 1 tablet (1,000 mg total) by mouth 2 (two) times daily for 10 days. 12/16/17 12/26/17  Lurline Idol, FNP  VITAMIN E PO Take 1 tablet by mouth daily.    [provider]    Family History Family History  Problem Relation Age of Onset  . Hypertension Mother   . Hypertension Father     Social History Social History   Tobacco Use  . Smoking status: Former Smoker    Packs/day: 0.00    Types: Cigarettes  . Smokeless tobacco: Never Used  . Tobacco comment: quit 2016  Substance Use Topics  . Alcohol use: No  . Drug use: No     Allergies   Avelox [moxifloxacin hcl in nacl]   Review of Systems Review of Systems  Gastrointestinal: Negative for abdominal pain, nausea and vomiting.  Genitourinary: Positive for genital sores. Negative for dysuria, flank pain, vaginal discharge and vaginal pain.  Musculoskeletal:  Negative for back pain.  All other systems reviewed and are negative.    Physical Exam Triage Vital Signs ED Triage Vitals  Enc Vitals Group     BP 12/16/17 2007 128/88     Pulse Rate 12/16/17 2007 83     Resp 12/16/17 2007 18     Temp 12/16/17 2007 98.5 F (36.9 C)     Temp Source 12/16/17 2007 Oral     SpO2 12/16/17 2007 100 %     Weight --      Height --      Head Circumference --      Peak Flow --      Pain Score 12/16/17 2004 0     Pain Loc --      Pain Edu? --      Excl. in GC? --    No data found.  Updated Vital Signs BP 128/88 (BP Location: Right Arm)   Pulse 83   Temp 98.5 F  (36.9 C) (Oral)   Resp 18   LMP 09/25/2013   SpO2 100%   Visual Acuity Right Eye Distance:   Left Eye Distance:   Bilateral Distance:    Right Eye Near:   Left Eye Near:    Bilateral Near:     Physical Exam  Constitutional: She is oriented to person, place, and time. She appears well-developed and well-nourished.  Cardiovascular: Normal rate and regular rhythm.  Pulmonary/Chest: Effort normal and breath sounds normal.  Genitourinary: There is tenderness and lesion on the right labia.  Musculoskeletal: Normal range of motion.  Neurological: She is alert and oriented to person, place, and time.  Skin: Skin is warm and dry.  Psychiatric: She has a normal mood and affect.     UC Treatments / Results  Labs (all labs ordered are listed, but only abnormal results are displayed) Labs Reviewed - No data to display  EKG None  Radiology No results found.  Procedures Procedures (including critical care time)  Medications Ordered in UC Medications - No data to display  Initial Impression / Assessment and Plan / UC Course  I have reviewed the triage vital signs and the nursing notes.  Pertinent labs & imaging results that were available during my care of the patient were reviewed by me and considered in my medical decision making (see chart for details).    47 yo female presenting with typical genital herpes outbreak.  Will treat with Valtrex.  In regards to her request for medical clearance, patient was advised that we are unable to provide specific documentation releasing her back to work for an injury that she was not evaluated here for.  We will, however, provide her a note that she was seen today and may return to work on tomorrow.  Should she need any further documentation, patient was advised to contact the Workmen's Comp. services.  Patient agreeable.  Today's evaluation has revealed no signs of a dangerous process. Discussed diagnosis with patient. Patient aware of  their diagnosis, possible red flag symptoms to watch out for and need for close follow up. Patient understands verbal and written discharge instructions. Patient comfortable with plan and disposition.  Patient has a clear mental status at this time, good insight into illness (after discussion and teaching) and has clear judgment to make decisions regarding their care.  Documentation was completed with the aid of voice recognition software. Transcription may contain typographical errors.  Final Clinical Impressions(s) / UC Diagnoses   Final diagnoses:  Herpes  simplex of female genitalia   Discharge Instructions   None    ED Prescriptions    Medication Sig Dispense Auth. Provider   valACYclovir (VALTREX) 1000 MG tablet Take 1 tablet (1,000 mg total) by mouth 2 (two) times daily for 10 days. 20 tablet Lurline IdolMurrill, Gibbs Naugle, FNP     Controlled Substance Prescriptions [place] Controlled Substance Registry consulted? Not Applicable   Lurline IdolMurrill, Shandricka Monroy, FNP 12/16/17 2030

## 2018-02-21 IMAGING — CR DG CLAVICLE*R*
2 series · 2 of 2 positions shown · non-contrast
Comparison: Chest radiograph dated 10/07/2008

CLINICAL DATA: 45-year-old female with trauma, pain and swelling in
the right clavicular area.

EXAM:
RIGHT CLAVICLE - 2+ VIEWS

[clavicle ap]
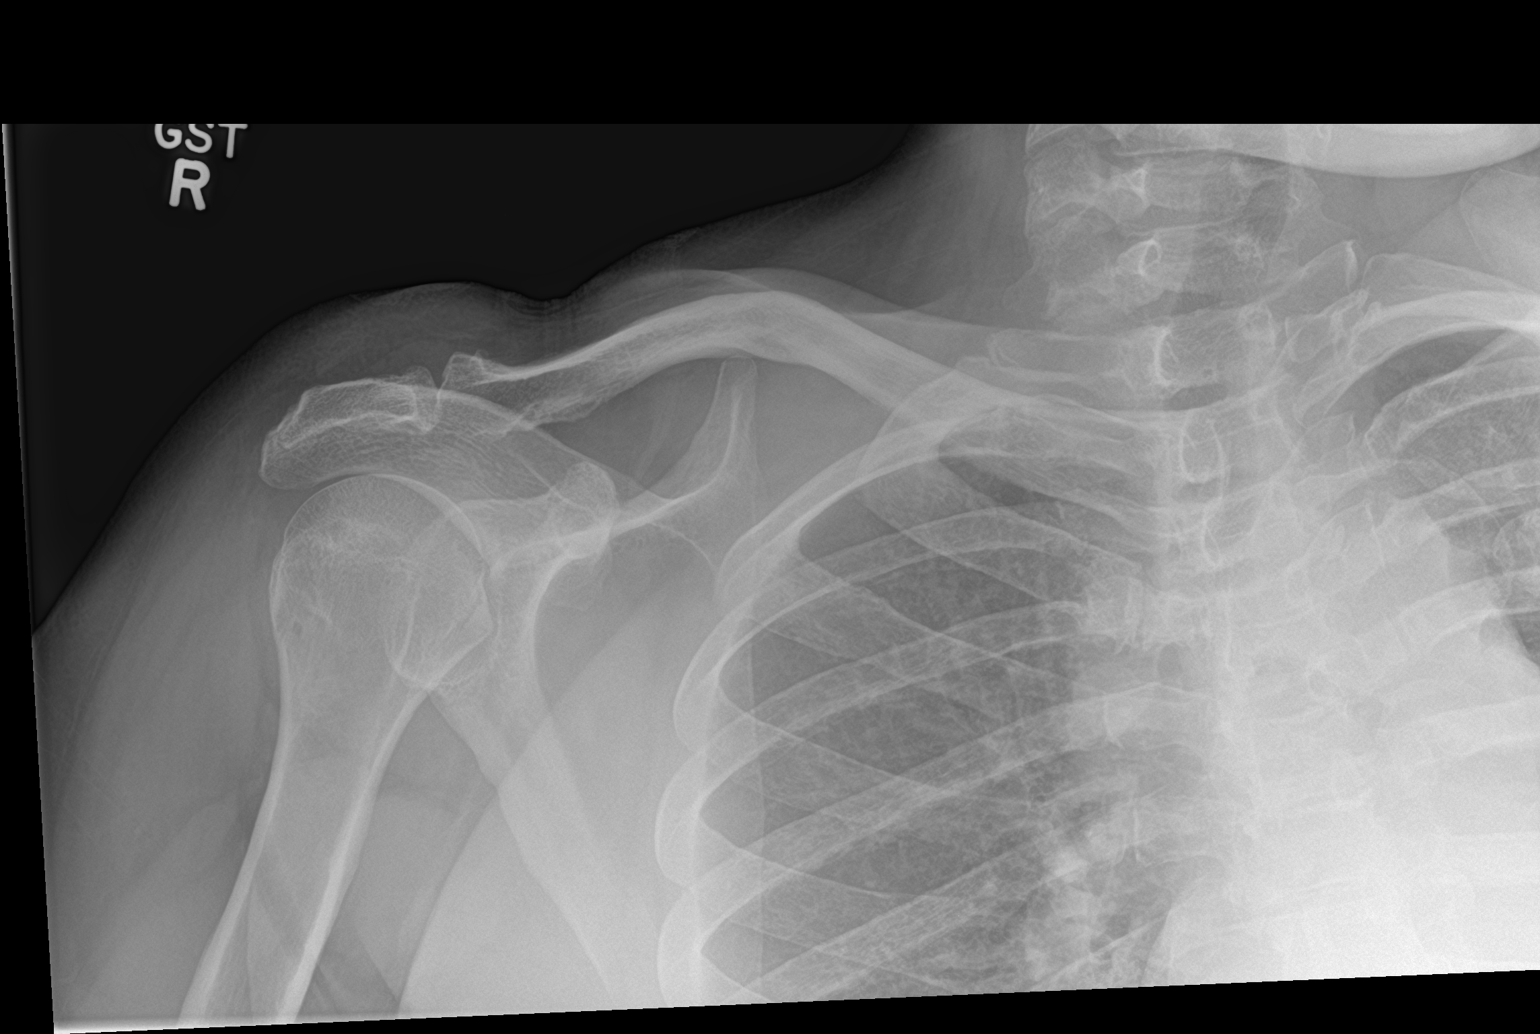

[clavicle axial]
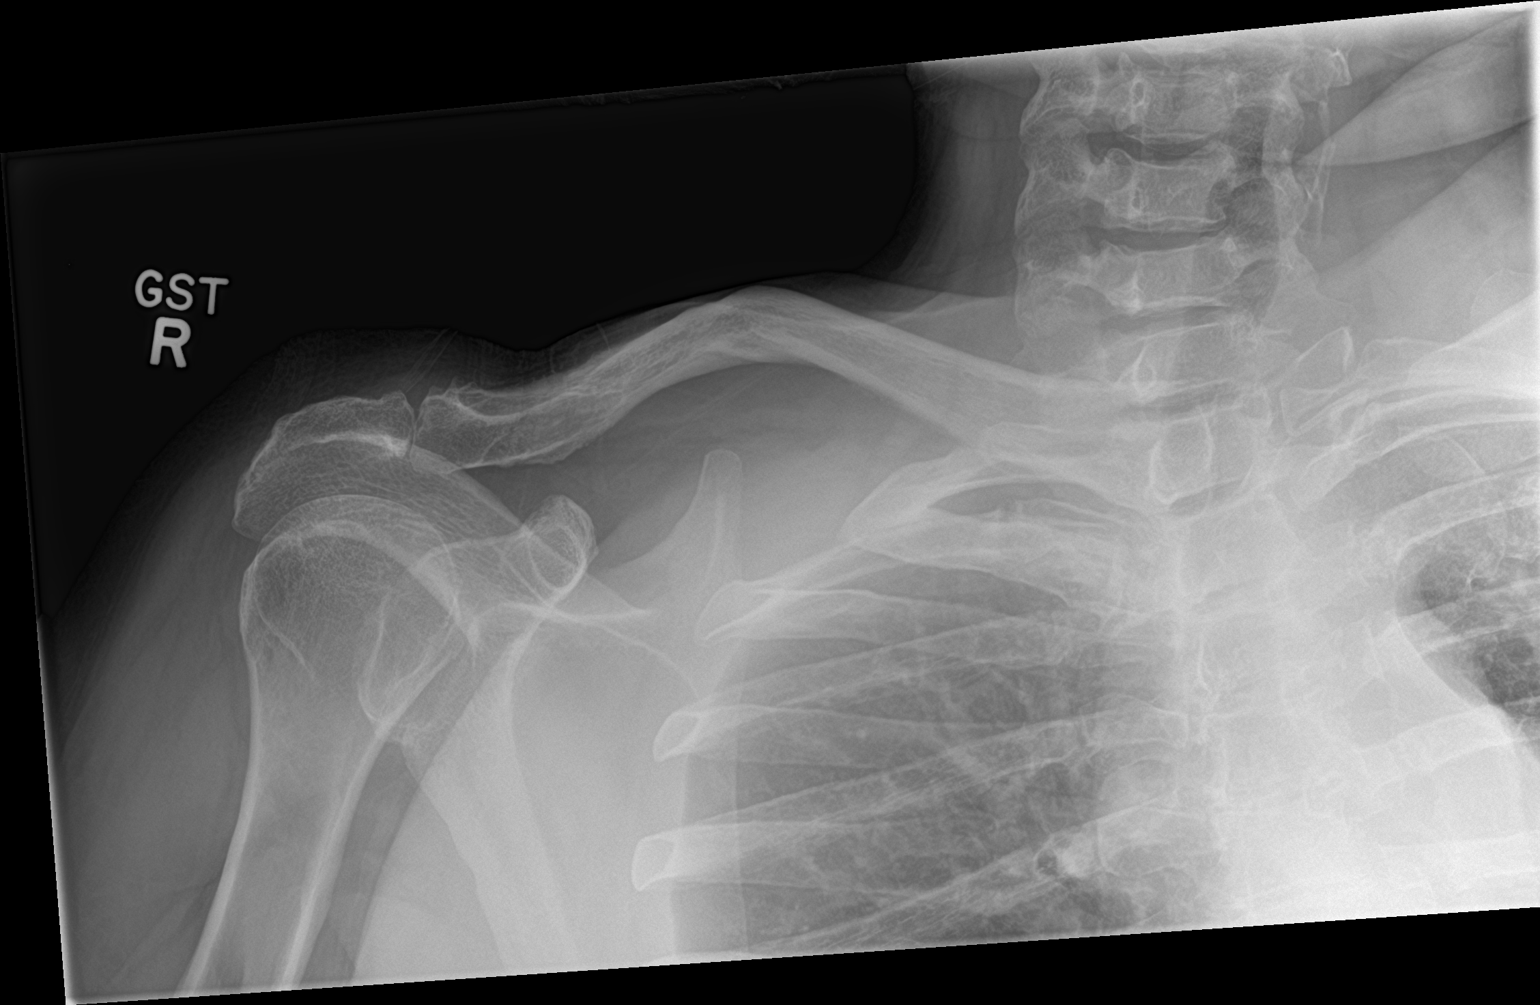

[2 of 2 positions shown; findings below may reference images not displayed]

FINDINGS: There is no acute fracture or dislocation. There is superior bowing
of the midportion of the right clavicle, likely related to an old
healed fracture. There is chronic changes of the right AC joint. The
soft tissues appear unremarkable.
IMPRESSION: No acute fracture or dislocation.

## 2018-03-07 IMAGING — DX DG WRIST COMPLETE 3+V*L*
1 series · 1 of 1 positions shown · non-contrast
Comparison: 11/07/2015

CLINICAL DATA: Follow-up left wrist fracture.

EXAM:
LEFT WRIST - COMPLETE 3+ VIEW

[wrist lat]
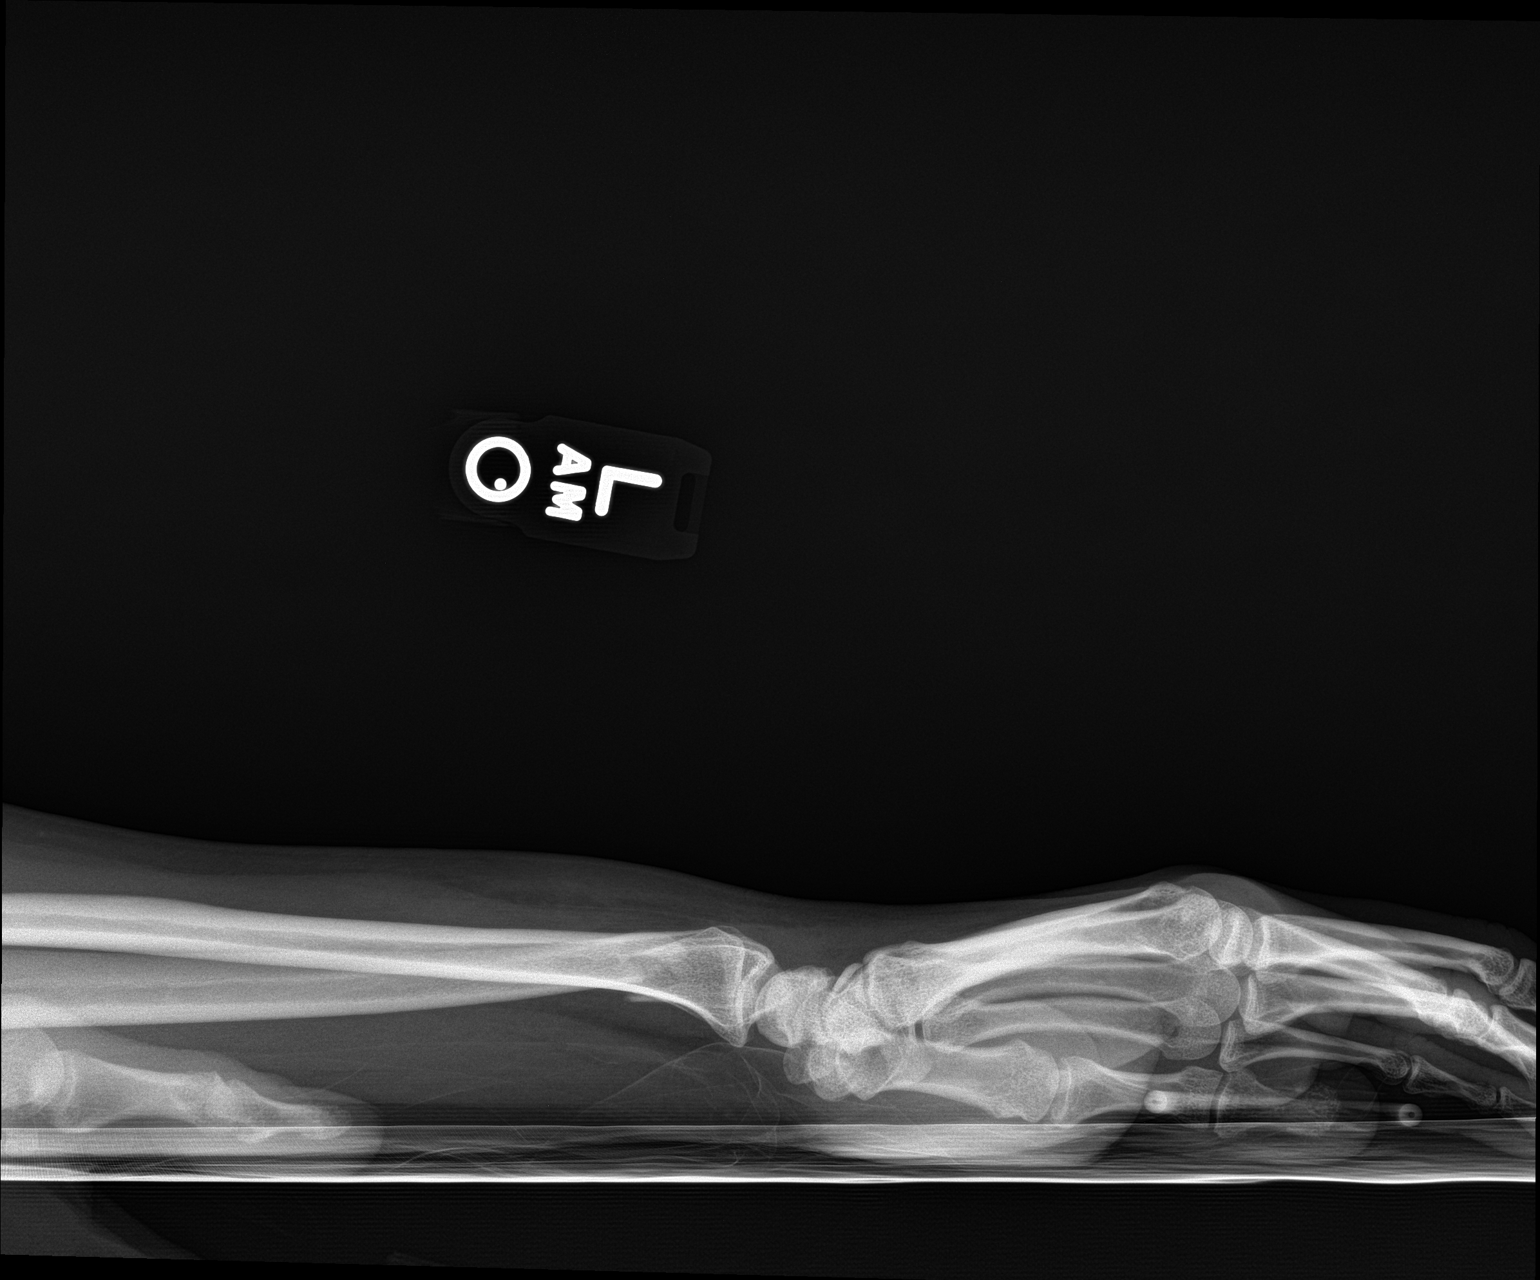

[1 of 1 positions shown; findings below may reference images not displayed]

FINDINGS: Again noted is a displaced oblique fracture of the distal ulna. The
amount of displacement has not significantly changed. Distal radius
is intact. Soft tissue swelling in the wrist has decreased.
IMPRESSION: Stable appearance of the displaced distal ulnar fracture.

## 2020-01-23 ENCOUNTER — Ambulatory Visit (HOSPITAL_COMMUNITY)
Admission: EM | Admit: 2020-01-23 | Discharge: 2020-01-23 | Disposition: A | Discharge status: Home / Self Care | Payer: Self-pay | Attending: Urgent Care | Admitting: Urgent Care

## 2020-01-23 ENCOUNTER — Other Ambulatory Visit: Payer: Self-pay

## 2020-01-23 ENCOUNTER — Ambulatory Visit (INDEPENDENT_AMBULATORY_CARE_PROVIDER_SITE_OTHER): Payer: Self-pay

## 2020-01-23 ENCOUNTER — Encounter (HOSPITAL_COMMUNITY): Payer: Self-pay

## 2020-01-23 DIAGNOSIS — M79671 Pain in right foot: Secondary | ICD-10-CM

## 2020-01-23 DIAGNOSIS — S9031XA Contusion of right foot, initial encounter: Secondary | ICD-10-CM

## 2020-01-23 DIAGNOSIS — S8991XA Unspecified injury of right lower leg, initial encounter: Secondary | ICD-10-CM

## 2020-01-23 DIAGNOSIS — S8001XA Contusion of right knee, initial encounter: Secondary | ICD-10-CM

## 2020-01-23 DIAGNOSIS — M25561 Pain in right knee: Secondary | ICD-10-CM

## 2020-01-23 MED ORDER — NAPROXEN 500 MG PO TABS: 500.0000 mg | ORAL_TABLET | Freq: Two times a day (BID) | ORAL | 0 refills | Status: DC

## 2020-01-23 MED ORDER — IBUPROFEN 800 MG PO TABS
800.0000 mg | ORAL_TABLET | Freq: Once | ORAL | Status: AC
  Administered 2020-01-23: 800 mg via ORAL

## 2020-01-23 MED ORDER — IBUPROFEN 800 MG PO TABS
ORAL_TABLET | ORAL | Status: AC
  Filled 2020-01-23: qty 1
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
## 2021-11-17 ENCOUNTER — Encounter (HOSPITAL_COMMUNITY): Payer: Self-pay | Admitting: Emergency Medicine

## 2021-11-17 ENCOUNTER — Emergency Department (HOSPITAL_COMMUNITY): Payer: BC Managed Care – PPO

## 2021-11-17 ENCOUNTER — Emergency Department (HOSPITAL_COMMUNITY)
Admission: EM | Admit: 2021-11-17 | Discharge: 2021-11-17 | Disposition: A | Discharge status: Home / Self Care | Payer: BC Managed Care – PPO | Attending: Student | Admitting: Student

## 2021-11-17 ENCOUNTER — Other Ambulatory Visit: Payer: Self-pay

## 2021-11-17 DIAGNOSIS — M25561 Pain in right knee: Secondary | ICD-10-CM

## 2021-11-17 MED ORDER — KETOROLAC TROMETHAMINE 10 MG PO TABS: 10.0000 mg | ORAL_TABLET | Freq: Four times a day (QID) | ORAL | 0 refills | Status: DC | PRN

## 2021-11-17 MED ORDER — KETOROLAC TROMETHAMINE 30 MG/ML IJ SOLN
30.0000 mg | Freq: Once | INTRAMUSCULAR | Status: AC
  Administered 2021-11-17: 30 mg via INTRAMUSCULAR
  Filled 2021-11-17: qty 1

## 2021-11-17 NOTE — ED Triage Notes (Signed)
Pt reports walking up steps when she heard her knee pop. Pt reports pain now 9/10.

## 2021-11-17 NOTE — ED Notes (Addendum)
Pt to XR

## 2021-11-17 NOTE — Discharge Instructions (Addendum)
Your xray today was negative for fracture or dislocation although there was some swelling noted within the joint space.  I have given you a brace here in the emergency department I have like you to wear this anytime you are up and walking, however you can take it off if you are lying on the couch or in bed.  Please call the orthopedic office in the morning so that you can have the knee better evaluated.

## 2021-11-17 NOTE — ED Provider Notes (Signed)
[facility] Provider Note   CSN: [id number] Arrival date & time: 11/17/21  1823     History  Chief Complaint  Patient presents with   Knee Injury    Kelsey Hines is a 51 y.o. female who presents emergency department for evaluation of right knee pain that started after she was going down the stairs 3 days ago.  She states that as she was going down, she felt that she may have missed stepped and needed to catch herself on that right leg.  She heard a pop.  Since then, she has had severe pain to the knee along with some swelling.  She has been ambulatory although this triggers pain.  She has been icing and using ibuprofen without improvement in symptoms.  She denies numbness and tingling.  She expresses concern that she may have a blood clot as a cause for her symptoms.  No previous history of DVT.  HPI     Home Medications Prior to Admission medications   Medication Sig Start Date End Date Taking? Authorizing Provider  acetaminophen (TYLENOL) 500 MG tablet Take 1,000 mg by mouth every 12 (twelve) hours as needed for moderate pain.    [provider]  buPROPion (WELLBUTRIN XL) 300 MG 24 hr tablet Take 300 mg by mouth daily.    [provider]  gabapentin (NEURONTIN) 300 MG capsule Take 300 mg by mouth 3 (three) times daily.    [provider]  ibuprofen (ADVIL,MOTRIN) 200 MG tablet Take 800 mg by mouth every 12 (twelve) hours as needed for moderate pain.    [provider]  ketorolac (TORADOL) 10 MG tablet Take 1 tablet (10 mg total) by mouth every 6 (six) hours as needed. 11/17/21   Janell Quiet, PA-C  naproxen (NAPROSYN) 500 MG tablet Take 1 tablet (500 mg total) by mouth 2 (two) times daily with a meal. 01/23/20   Wallis Bamberg, PA-C  VITAMIN E PO Take 1 tablet by mouth daily.    [provider]      Allergies    Avelox [moxifloxacin hcl in nacl]    Review of Systems   Review of Systems   Constitutional:  Negative for fever.  Musculoskeletal:  Positive for arthralgias, gait problem and joint swelling. Negative for myalgias.  Skin:  Negative for wound.  Neurological:  Negative for numbness.    Physical Exam Updated Vital Signs BP (!) 161/94   Pulse 90   Temp 98.1 F (36.7 C)   Resp 20   Ht 5\' 7"  (1.702 m)   Wt 90.7 kg   LMP 09/25/2013   SpO2 96%   BMI 31.32 kg/m  Physical Exam Vitals and nursing note reviewed.  Constitutional:      General: She is not in acute distress.    Appearance: She is not ill-appearing.  HENT:     Head: Atraumatic.  Eyes:     Conjunctiva/sclera: Conjunctivae normal.  Cardiovascular:     Rate and Rhythm: Normal rate and regular rhythm.     Pulses: Normal pulses.          Dorsalis pedis pulses are 2+ on the right side and 2+ on the left side.     Heart sounds: No murmur heard. Pulmonary:     Effort: Pulmonary effort is normal. No respiratory distress.     Breath sounds: Normal breath sounds.  Abdominal:     General: Abdomen is flat. There is no distension.     Palpations:  Abdomen is soft.     Tenderness: There is no abdominal tenderness.  Musculoskeletal:        General: Normal range of motion.     Cervical back: Normal range of motion.     Right lower leg: No edema.     Left lower leg: No edema.       Legs:     Comments: Mild swelling noted to the right knee, particularly just lateral and superior to the patella.  Tenderness noted to the same area.  She has full range of motion.    Bruising and discoloration noted to the anterior right thigh.  Nontender to palpation.  No known trauma or injury to that area.  No posterior calf tenderness bilaterally  Skin:    General: Skin is warm and dry.     Capillary Refill: Capillary refill takes less than 2 seconds.  Neurological:     General: No focal deficit present.     Mental Status: She is alert.  Psychiatric:        Mood and Affect: Mood normal.     ED Results /  Procedures / Treatments   Labs (all labs ordered are listed, but only abnormal results are displayed) Labs Reviewed - No data to display  EKG None  Radiology DG Knee Complete 4 Views Right  Result Date: 11/17/2021 CLINICAL DATA:  Right knee pain since injury walking up steps 2 days ago. EXAM: RIGHT KNEE - COMPLETE 4+ VIEW COMPARISON:  Right knee x-rays dated January 23, 2020. FINDINGS: No acute fracture or dislocation. Unchanged small joint effusion. Unchanged bulky tricompartmental marginal osteophytes. New mild lateral compartment joint space narrowing. Soft tissues are unremarkable. IMPRESSION: 1. No acute osseous abnormality. 2. 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 Electronically Signed   By: Obie Dredge M.D.   On: 11/17/2021 19:05    Procedures Procedures    Medications Ordered in ED Medications  ketorolac (TORADOL) 30 MG/ML injection 30 mg (30 mg Intramuscular Given 11/17/21 1857)    ED Course/ Medical Decision Making/ A&P                           Medical Decision Making Amount and/or Complexity of Data Reviewed Radiology: ordered.  Risk Prescription drug management.   51 year old female presents to the emergency department for evaluation of right knee injury that occurred over the weekend.  Differentials include sprain, ligament tear, meniscal tear, fracture, dislocation, contusion.  Vitals are without significant abnormality.  There is some swelling and tenderness noted to the anterior knee just superior and lateral to the patella.  She has full active and passive range of motion.  Distal pulses intact.  I ordered and interpreted x-ray which is negative for fracture or dislocation, although notable for arthritis and a small joint effusion.  Assured patient that symptoms are not consistent with a blood clot as she has no posterior calf tenderness and pain is to the anterior knee.  I attempted to put patient in a knee brace here in the  emergency department, however we were out so she was advised to stop at a drugstore to pick some up.  She is requesting prescription for pain medications and I advised that her current condition does not warrant narcotic prescription.  I have sent her in a prescription for Toradol instead.  Advise discontinuation of other NSAIDs while using the Toradol.  Additionally, given the joint effusion and her pain I do think that she  should follow-up with orthopedics for better evaluation.  Referral was provided and patient expresses understanding and is amenable to plan.  Discharged home in stable condition. Final Clinical Impression(s) / ED Diagnoses Final diagnoses:  Acute pain of right knee    Rx / DC Orders ED Discharge Orders          Ordered    ketorolac (TORADOL) 10 MG tablet  Every 6 hours PRN,   Status:  Discontinued        11/17/21 1921    ketorolac (TORADOL) 10 MG tablet  Every 6 hours PRN        11/17/21 1943              Delight Ovens 11/17/21 2340    Kommor, Wyn Forster, MD 11/18/21 [phone]

## 2021-11-17 NOTE — ED Notes (Signed)
I provided reinforced discharge education based off of after visit summary/care provided. Pt acknowledged and understood my education. Pt had no further questions/concerns for provider/myself. After visit summary provided to pt. 

## 2022-02-24 DIAGNOSIS — F321 Major depressive disorder, single episode, moderate: Secondary | ICD-10-CM | POA: Insufficient documentation

## 2022-03-17 DIAGNOSIS — F411 Generalized anxiety disorder: Secondary | ICD-10-CM | POA: Insufficient documentation

## 2022-07-02 ENCOUNTER — Other Ambulatory Visit: Payer: Self-pay

## 2022-07-02 ENCOUNTER — Emergency Department (HOSPITAL_COMMUNITY)
Admission: EM | Admit: 2022-07-02 | Discharge: 2022-07-03 | Disposition: A | Discharge status: Home / Self Care | Payer: BC Managed Care – PPO | Attending: Emergency Medicine | Admitting: Emergency Medicine

## 2022-07-02 DIAGNOSIS — R112 Nausea with vomiting, unspecified: Secondary | ICD-10-CM | POA: Diagnosis not present

## 2022-07-02 DIAGNOSIS — R109 Unspecified abdominal pain: Secondary | ICD-10-CM

## 2022-07-02 DIAGNOSIS — R1031 Right lower quadrant pain: Secondary | ICD-10-CM | POA: Diagnosis present

## 2022-07-02 DIAGNOSIS — N179 Acute kidney failure, unspecified: Secondary | ICD-10-CM | POA: Insufficient documentation

## 2022-07-02 MED ORDER — ONDANSETRON 4 MG PO TBDP
4.0000 mg | ORAL_TABLET | Freq: Once | ORAL | Status: AC
  Administered 2022-07-02: 4 mg via ORAL
  Filled 2022-07-02: qty 1

## 2022-07-02 NOTE — ED Triage Notes (Signed)
Patient reports pain at right upper flank / pain across lower abdomen with emesis and fever onset this morning , denies hematuria or dysuria .

## 2022-07-02 NOTE — ED Provider Triage Note (Signed)
Emergency Medicine Provider Triage Evaluation Note  Destene Barten , a 52 y.o. female  was evaluated in triage.  Pt complains of right-sided flank and right lower quadrant pain which began this morning with subsequent nausea and vomiting.  Patient denies urinary symptoms at this time, shortness of breath, chest pain.  She does endorse intermittent constipation but states she had a bowel movement earlier today  Review of Systems  Positive: As above Negative: As above  Physical Exam  BP (!) 153/92 (BP Location: Right Arm)   Pulse (!) 101   Temp 97.6 F (36.4 C) (Oral)   Resp 16   LMP 09/25/2013   SpO2 100%  Gen:   Awake, no distress   Resp:  Normal effort  MSK:   Moves extremities without difficulty  Other:    Medical Decision Making  Medically screening exam initiated at 11:51 PM.  Appropriate orders placed.  Laporshia Normoyle was informed that the remainder of the evaluation will be completed by another provider, this initial triage assessment does not replace that evaluation, and the importance of remaining in the ED until their evaluation is complete.     Dorothyann Peng, PA-C 07/02/22 2351

## 2022-07-03 ENCOUNTER — Emergency Department (HOSPITAL_COMMUNITY): Payer: BC Managed Care – PPO

## 2022-07-03 DIAGNOSIS — R1031 Right lower quadrant pain: Secondary | ICD-10-CM | POA: Diagnosis not present

## 2022-07-03 LAB — COMPREHENSIVE METABOLIC PANEL
ALT: 16 U/L (ref 0–44)
AST: 19 U/L (ref 15–41)
Albumin: 4 g/dL (ref 3.5–5.0)
Alkaline Phosphatase: 60 U/L (ref 38–126)
Anion gap: 12 (ref 5–15)
BUN: 13 mg/dL (ref 6–20)
CO2: 26 mmol/L (ref 22–32)
Calcium: 9.4 mg/dL (ref 8.9–10.3)
Chloride: 97 mmol/L — ABNORMAL LOW (ref 98–111)
Creatinine, Ser: 1.38 mg/dL — ABNORMAL HIGH (ref 0.44–1.00)
GFR, Estimated: 47 mL/min — ABNORMAL LOW (ref >60)
Glucose, Bld: 107 mg/dL — ABNORMAL HIGH (ref 70–99)
Potassium: 3.4 mmol/L — ABNORMAL LOW (ref 3.5–5.1)
Sodium: 135 mmol/L (ref 135–145)
Total Bilirubin: 0.6 mg/dL (ref 0.3–1.2)
Total Protein: 7 g/dL (ref 6.5–8.1)

## 2022-07-03 LAB — URINALYSIS, ROUTINE W REFLEX MICROSCOPIC
Bacteria, UA: NONE SEEN
Bilirubin Urine: NEGATIVE
Glucose, UA: NEGATIVE mg/dL
Hgb urine dipstick: NEGATIVE
Ketones, ur: 5 mg/dL — AB
Nitrite: NEGATIVE
Protein, ur: NEGATIVE mg/dL
Specific Gravity, Urine: 1.019 (ref 1.005–1.030)
pH: 5 (ref 5.0–8.0)

## 2022-07-03 LAB — CBC WITH DIFFERENTIAL/PLATELET
Abs Immature Granulocytes: 0.03 10*3/uL (ref 0.00–0.07)
Basophils Absolute: 0 10*3/uL (ref 0.0–0.1)
Basophils Relative: 0 %
Eosinophils Absolute: 0.1 10*3/uL (ref 0.0–0.5)
Eosinophils Relative: 1 %
HCT: 40.7 % (ref 36.0–46.0)
Hemoglobin: 13.9 g/dL (ref 12.0–15.0)
Immature Granulocytes: 0 %
Lymphocytes Relative: 21 %
Lymphs Abs: 2 10*3/uL (ref 0.7–4.0)
MCH: 30.6 pg (ref 26.0–34.0)
MCHC: 34.2 g/dL (ref 30.0–36.0)
MCV: 89.6 fL (ref 80.0–100.0)
Monocytes Absolute: 0.8 10*3/uL (ref 0.1–1.0)
Monocytes Relative: 8 %
Neutro Abs: 6.6 10*3/uL (ref 1.7–7.7)
Neutrophils Relative %: 70 %
Platelets: 355 10*3/uL (ref 150–400)
RBC: 4.54 MIL/uL (ref 3.87–5.11)
RDW: 15 % (ref 11.5–15.5)
WBC: 9.5 10*3/uL (ref 4.0–10.5)
nRBC: 0 % (ref 0.0–0.2)

## 2022-07-03 LAB — LIPASE, BLOOD: Lipase: 29 U/L (ref 11–51)

## 2022-07-03 MED ORDER — IOHEXOL 350 MG/ML SOLN
75.0000 mL | Freq: Once | INTRAVENOUS | Status: AC | PRN
  Administered 2022-07-03: 75 mL via INTRAVENOUS

## 2022-07-03 MED ORDER — SODIUM CHLORIDE 0.9 % IV BOLUS
1000.0000 mL | Freq: Once | INTRAVENOUS | Status: AC
  Administered 2022-07-03: 1000 mL via INTRAVENOUS

## 2022-07-03 MED ORDER — ONDANSETRON HCL 4 MG PO TABS: 4.0000 mg | ORAL_TABLET | Freq: Three times a day (TID) | ORAL | 0 refills | Status: DC | PRN

## 2022-07-03 NOTE — Discharge Instructions (Signed)
You were evaluated today for abdominal pain with nausea and vomiting.  Your workup was reassuring.  This is likely a viral process.  I have prescribed Zofran for your nausea.  Please be sure to hydrate as you are able as your nausea improves increase your hydration over the next week.  You need a follow-up appointment with your primary care provider in 1 week to recheck your kidney function.  If you develop any life-threatening symptoms please return to the emergency department.

## 2022-07-03 NOTE — ED Provider Notes (Signed)
[facility] Provider Note   CSN: [id number] Arrival date & time: 07/02/22  2339     History  Chief Complaint  Patient presents with   Abdominal Pain    Flank Pain     Lama Burckhard is a 52 y.o. female.  Patient presents the emergency department complaining of pain in the right upper flank, across right lower quadrant of the abdomen, nausea with emesis which began early in the morning yesterday.  Patient denies hematuria, dysuria, shortness of breath, chest pain.  The pain is rated as severe in intensity, sharp.  She denies hematemesis and blood in her stool.  Past medical history significant for history of herniated disks, arthritis, hypertension  HPI     Home Medications Prior to Admission medications   Medication Sig Start Date End Date Taking? Authorizing Provider  ondansetron (ZOFRAN) 4 MG tablet Take 1 tablet (4 mg total) by mouth every 8 (eight) hours as needed for nausea or vomiting. 07/03/22  Yes Dorothyann Peng, PA-C  acetaminophen (TYLENOL) 500 MG tablet Take 1,000 mg by mouth every 12 (twelve) hours as needed for moderate pain.    [provider]  buPROPion (WELLBUTRIN XL) 300 MG 24 hr tablet Take 300 mg by mouth daily.    [provider]  gabapentin (NEURONTIN) 300 MG capsule Take 300 mg by mouth 3 (three) times daily.    [provider]  ibuprofen (ADVIL,MOTRIN) 200 MG tablet Take 800 mg by mouth every 12 (twelve) hours as needed for moderate pain.    [provider]  ketorolac (TORADOL) 10 MG tablet Take 1 tablet (10 mg total) by mouth every 6 (six) hours as needed. 11/17/21   Tonye Pearson, PA-C  naproxen (NAPROSYN) 500 MG tablet Take 1 tablet (500 mg total) by mouth 2 (two) times daily with a meal. 01/23/20   Jaynee Eagles, PA-C  VITAMIN E PO Take 1 tablet by mouth daily.    [provider]      Allergies    Avelox [moxifloxacin hcl in nacl]    Review of Systems    Review of Systems  Physical Exam Updated Vital Signs BP (!) 133/91   Pulse 91   Temp 97.9 F (36.6 C)   Resp 17   LMP 09/25/2013   SpO2 96%  Physical Exam Vitals and nursing note reviewed.  Constitutional:      General: She is not in acute distress.    Appearance: She is well-developed.  HENT:     Head: Normocephalic and atraumatic.  Eyes:     Conjunctiva/sclera: Conjunctivae normal.  Cardiovascular:     Rate and Rhythm: Normal rate and regular rhythm.     Heart sounds: No murmur heard. Pulmonary:     Effort: Pulmonary effort is normal. No respiratory distress.     Breath sounds: Normal breath sounds.  Abdominal:     Palpations: Abdomen is soft.     Tenderness: There is abdominal tenderness in the right lower quadrant. There is right CVA tenderness.  Musculoskeletal:        General: No swelling.     Cervical back: Neck supple.  Skin:    General: Skin is warm and dry.     Capillary Refill: Capillary refill takes less than 2 seconds.  Neurological:     Mental Status: She is alert.  Psychiatric:        Mood and Affect: Mood normal.     ED Results / Procedures /  Treatments   Labs (all labs ordered are listed, but only abnormal results are displayed) Labs Reviewed  COMPREHENSIVE METABOLIC PANEL - Abnormal; Notable for the following components:      Result Value   Potassium 3.4 (*)    Chloride 97 (*)    Glucose, Bld 107 (*)    Creatinine, Ser 1.38 (*)    GFR, Estimated 47 (*)    All other components within normal limits  URINALYSIS, ROUTINE W REFLEX MICROSCOPIC - Abnormal; Notable for the following components:   APPearance HAZY (*)    Ketones, ur 5 (*)    Leukocytes,Ua MODERATE (*)    All other components within normal limits  CBC WITH DIFFERENTIAL/PLATELET  LIPASE, BLOOD    EKG None  Radiology CT Abdomen Pelvis W Contrast  Result Date: 07/03/2022 CLINICAL DATA:  Right lower quadrant abdominal pain. Pain at right upper flank across lower abdomen with  emesis and fever. EXAM: CT ABDOMEN AND PELVIS WITH CONTRAST TECHNIQUE: Multidetector CT imaging of the abdomen and pelvis was performed using the standard protocol following bolus administration of intravenous contrast. RADIATION DOSE REDUCTION: This exam was performed according to the departmental dose-optimization program which includes automated exposure control, adjustment of the mA and/or kV according to patient size and/or use of iterative reconstruction technique. CONTRAST:  2mL OMNIPAQUE IOHEXOL 350 MG/ML SOLN COMPARISON:  None Available. FINDINGS: Lower chest: No acute abnormality. Hepatobiliary: No focal liver abnormality is seen. No gallstones, gallbladder wall thickening, or biliary dilatation. Pancreas: Unremarkable. No pancreatic ductal dilatation or surrounding inflammatory changes. Spleen: Normal in size without focal abnormality. Adrenals/Urinary Tract: The adrenal glands are within normal limits. The kidneys enhance symmetrically. A subcentimeter hypodensity is noted in the left kidney which is too small to further characterize. No renal calculus or hydronephrosis. The bladder is unremarkable. Stomach/Bowel: Stomach is within normal limits. Appendix appears normal. No evidence of bowel wall thickening, distention, or inflammatory changes. No free air or pneumatosis. Vascular/Lymphatic: Aortic atherosclerosis. No enlarged abdominal or pelvic lymph nodes. Reproductive: Uterus and bilateral adnexa are unremarkable. Other: No abdominopelvic ascites. A fat containing ventral abdominal wall hernia is noted superior to the umbilicus. Musculoskeletal: Degenerative changes are present in the thoracolumbar spine. A sclerotic lesion is noted at T12, likely bone island. No acute osseous abnormality. IMPRESSION: 1. No acute intra-abdominal process. No renal calculus or hydronephrosis. 2. Normal appendix. 3. Aortic atherosclerosis. Electronically Signed   By: Brett Fairy M.D.   On: 07/03/2022 02:11     Procedures Procedures    Medications Ordered in ED Medications  ondansetron (ZOFRAN-ODT) disintegrating tablet 4 mg (4 mg Oral Given 07/02/22 2353)  iohexol (OMNIPAQUE) 350 MG/ML injection 75 mL (75 mLs Intravenous Contrast Given 07/03/22 0147)  sodium chloride 0.9 % bolus 1,000 mL (1,000 mLs Intravenous New Bag/Given 07/03/22 0444)    ED Course/ Medical Decision Making/ A&P                             Medical Decision Making Amount and/or Complexity of Data Reviewed Labs: ordered. Radiology: ordered.  Risk Prescription drug management.   This patient presents to the ED for concern of abdominal pain with nausea and vomiting, this involves an extensive number of treatment options, and is a complaint that carries with it a high risk of complications and morbidity.  The differential diagnosis includes nephrolithiasis, pyelonephritis, gastritis, cholecystitis, appendicitis, others   Co morbidities that complicate the patient evaluation  Hypertension   Lab Tests:  I Ordered, and personally interpreted labs.  The pertinent results include: Creatinine 1.38 (baseline around 1), unremarkable CBC, lipase 29, UA with moderate leukocytes but negative for nitrates, WBC, bacteria   Imaging Studies ordered:  I ordered imaging studies including CT abdomen pelvis with contrast I independently visualized and interpreted imaging which showed no acute findings such as nephrolithiasis, appendicitis I agree with the radiologist interpretation   Problem List / ED Course / Critical interventions / Medication management   I ordered medication including Zofran for nausea, normal saline bolus for fluid resuscitation Reevaluation of the patient after these medicines showed that the patient improved I have reviewed the patients home medicines and have made adjustments as needed   Test / Admission - Considered:  Patient's nausea has subsided significantly with Zofran.  CT scan was grossly  unremarkable.  No signs of nephrolithiasis, appendicitis, cholecystitis.  Patient does have a mild AKI with creatinine 1.38.  I feel that the overall symptoms are likely due to a viral illness.  Plan to discharge patient home with Zofran prescription and recommendation for follow-up in 1 week with primary care for repeat metabolic panel to check on resolution of acute kidney injury.  Patient voices agreement with the plan and is ready to discharge home.  Return precautions provided        Final Clinical Impression(s) / ED Diagnoses Final diagnoses:  Abdominal pain, unspecified abdominal location  Nausea and vomiting, unspecified vomiting type  AKI (acute kidney injury) ([place])    Rx / DC Orders ED Discharge Orders          Ordered    ondansetron (ZOFRAN) 4 MG tablet  Every 8 hours PRN        07/03/22 0436              Dorothyann Peng, PA-C [phone]    Delora Fuel, MD [phone]

## 2022-09-21 NOTE — Progress Notes (Unsigned)
Urogynecology New Patient Evaluation and Consultation  Referring Provider: Diamantina Providence, FNP PCP: Diamantina Providence, FNP Date of Service: 09/22/2022  SUBJECTIVE Chief Complaint: No chief complaint on file.  History of Present Illness: Kelsey Hines is a 52 y.o. Black or African-American female seen in consultation for evaluation of prolapse and OAB symptoms.    ***Review of records significant for: ***  Urinary Symptoms: {urine leakage?:24754} Leaks *** time(s) per {days/wks/mos/yrs:310907}.  Pad use: {NUMBERS 1-10:18281} {pad option:24752} per day.   She {ACTION; IS/IS UXN:23557322} bothered by her UI symptoms.  Day time voids ***.  Nocturia: *** times per night to void. Voiding dysfunction: she {empties:24755} her bladder well.  {DOES NOT does:27190::"does not"} use a catheter to empty bladder.  When urinating, she feels {urine symptoms:24756} Drinks: *** per day  UTIs: {NUMBERS 1-10:18281} UTI's in the last year.   {ACTIONS;DENIES/REPORTS:21021675::"Denies"} history of {urologic concerns:24757}  Pelvic Organ Prolapse Symptoms:                  She {denies/ admits to:24761} a feeling of a bulge the vaginal area. It has been present for {NUMBER 1-10:22536} {days/wks/mos/yrs:310907}.  She {denies/ admits to:24761} seeing a bulge.  This bulge {ACTION; IS/IS GUR:42706237} bothersome.  Bowel Symptom: Bowel movements: *** time(s) per {Time; day/week/month:13537} Stool consistency: {stool consistency:24758} Straining: {yes/no:19897}.  Splinting: {yes/no:19897}.  Incomplete evacuation: {yes/no:19897}.  She {denies/ admits to:24761} accidental bowel leakage / fecal incontinence  Occurs: *** time(s) per {Time; day/week/month:13537}  Consistency with leakage: {stool consistency:24758} Bowel regimen: {bowel regimen:24759} Last colonoscopy: Date ***, Results ***  Sexual Function Sexually active: {yes/no:19897}.  Sexual orientation: {Sexual  Orientation:[phone]} Pain with sex: {pain with sex:24762}  Pelvic Pain {denies/ admits to:24761} pelvic pain Location: *** Pain occurs: *** Prior pain treatment: *** Improved by: *** Worsened by: ***   Past Medical History:  Past Medical History:  Diagnosis Date   Ankylosis    Arthritis    Depression    Herniated disc    Hypertension    Scoliosis (and kyphoscoliosis), idiopathic      Past Surgical History:   Past Surgical History:  Procedure Laterality Date   KNEE SURGERY     SHOULDER ARTHROSCOPY       Past OB/GYN History: G{NUMBERS 1-10:18281} P{NUMBERS 1-10:18281} Vaginal deliveries: ***,  Forceps/ Vacuum deliveries: ***, Cesarean section: *** Menopausal: {menopausal:24763} Contraception: ***. Last pap smear was ***.  Any history of abnormal pap smears: {yes/no:19897}.   Medications: She has a current medication list which includes the following prescription(s): acetaminophen, bupropion, gabapentin, ibuprofen, ketorolac, naproxen, ondansetron, and vitamin e.   Allergies: Patient is allergic to avelox [moxifloxacin hcl in nacl].   Social History:  Social History   Tobacco Use   Smoking status: Former    Packs/day: 0    Types: Cigarettes   Smokeless tobacco: Never   Tobacco comments:    quit 2016  Substance Use Topics   Alcohol use: No   Drug use: No    Relationship status: {relationship status:24764} She lives with ***.   She {ACTION; IS/IS SEG:31517616} employed ***. Regular exercise: {Yes/No:304960894} History of abuse: {Yes/No:304960894}  Family History:   Family History  Problem Relation Age of Onset   Hypertension Mother    Hypertension Father      Review of Systems: ROS   OBJECTIVE Physical Exam: There were no vitals filed for this visit.  Physical Exam   GU / Detailed Urogynecologic Evaluation:  Pelvic Exam: Normal external female genitalia; Bartholin's and Skene's glands normal in  appearance; urethral meatus normal in  appearance, no urethral masses or discharge.   CST: {gen negative/positive:315881}  Reflexes: bulbocavernosis {DESC; PRESENT/NOT PRESENT:21021351}, anocutaneous {DESC; PRESENT/NOT PRESENT:21021351} ***bilaterally.  Speculum exam reveals normal vaginal mucosa {With/Without:20273} atrophy. Cervix {exam; gyn cervix:30847}. Uterus {exam; pelvic uterus:30849}. Adnexa {exam; adnexa:12223}.    s/p hysterectomy: Speculum exam reveals normal vaginal mucosa {With/Without:20273}  atrophy and normal vaginal cuff.  Adnexa {exam; adnexa:12223}.    With apex supported, anterior compartment defect was {reduced:24765}  Pelvic floor strength {Roman # I-V:19040}/V, puborectalis {Roman # I-V:19040}/V external anal sphincter {Roman # I-V:19040}/V  Pelvic floor musculature: Right levator {Tender/Non-tender:20250}, Right obturator {Tender/Non-tender:20250}, Left levator {Tender/Non-tender:20250}, Left obturator {Tender/Non-tender:20250}  POP-Q:   POP-Q                                               Aa                                               Ba                                                 C                                                Gh                                               Pb                                               tvl                                                Ap                                               Bp                                                 D      Rectal Exam:  Normal sphincter tone, {rectocele:24766} distal rectocele, enterocoele {DESC; PRESENT/NOT PRESENT:21021351}, no rectal masses, {sign of:24767} dyssynergia when asking the patient to bear down.  Post-Void Residual (PVR) by Bladder Scan: In order to evaluate bladder emptying, we discussed obtaining a postvoid residual and she  agreed to this procedure.  Procedure: The ultrasound unit was placed on the patient's abdomen in the suprapubic region after the patient had voided. A PVR of *** ml  was obtained by bladder scan.  Laboratory Results: @ENCLABS @   ***I visualized the urine specimen, noting the specimen to be {urine color:24768}  ASSESSMENT AND PLAN Ms. Stonum is a 52 y.o. with: No diagnosis found.    Selmer Dominion, NP   Medical Decision Making:  - Reviewed/ ordered a clinical laboratory test - Reviewed/ ordered a radiologic study - Reviewed/ ordered medicine test - Decision to obtain old records - Discussion of management of or test interpretation with an external physician / other healthcare professional  - Assessment requiring independent historian - Review and summation of prior records - Independent review of image, tracing or specimen

## 2022-09-22 ENCOUNTER — Encounter: Payer: Self-pay | Admitting: Obstetrics and Gynecology

## 2022-09-22 ENCOUNTER — Ambulatory Visit (INDEPENDENT_AMBULATORY_CARE_PROVIDER_SITE_OTHER): Payer: BC Managed Care – PPO | Admitting: Obstetrics and Gynecology

## 2022-09-22 VITALS — BP 139/87 | HR 78 | Ht 66.0 in | Wt 177.0 lb

## 2022-09-22 DIAGNOSIS — N8110 Cystocele, unspecified: Secondary | ICD-10-CM

## 2022-09-22 DIAGNOSIS — N816 Rectocele: Secondary | ICD-10-CM

## 2022-09-22 DIAGNOSIS — Z01419 Encounter for gynecological examination (general) (routine) without abnormal findings: Secondary | ICD-10-CM

## 2022-09-22 DIAGNOSIS — N814 Uterovaginal prolapse, unspecified: Secondary | ICD-10-CM

## 2022-09-22 DIAGNOSIS — R350 Frequency of micturition: Secondary | ICD-10-CM | POA: Diagnosis not present

## 2022-09-22 DIAGNOSIS — N3941 Urge incontinence: Secondary | ICD-10-CM | POA: Diagnosis not present

## 2022-09-22 DIAGNOSIS — N393 Stress incontinence (female) (male): Secondary | ICD-10-CM

## 2022-09-22 LAB — POCT URINALYSIS DIPSTICK
Bilirubin, UA: NEGATIVE
Blood, UA: NEGATIVE
Glucose, UA: NEGATIVE
Ketones, UA: NEGATIVE
Leukocytes, UA: NEGATIVE
Nitrite, UA: NEGATIVE
Protein, UA: NEGATIVE
Spec Grav, UA: 1.02 (ref 1.010–1.025)
Urobilinogen, UA: 0.2 E.U./dL
pH, UA: 7.5 (ref 5.0–8.0)

## 2022-09-22 NOTE — Patient Instructions (Addendum)
You have stage 2-3 out of 4 Anterior vaginal wall prolapse (Bladder)  Stage 2 Uterine Prolapse Stage 1 Posterior vaginal prolapse.   Information given on surgical options  Please consider getting a colonoscopy and we need to get you into Women's health for a pap smear if you are planning for surgery.   We will plan for Urodynamics to assess your bladder function and leakage and then you will meet with the surgeon to decide on surgery.

## 2022-10-07 ENCOUNTER — Other Ambulatory Visit (HOSPITAL_COMMUNITY)
Admission: RE | Admit: 2022-10-07 | Discharge: 2022-10-07 | Disposition: A | Discharge status: Home / Self Care | Payer: BC Managed Care – PPO | Source: Ambulatory Visit | Attending: Obstetrics and Gynecology | Admitting: Obstetrics and Gynecology

## 2022-10-07 ENCOUNTER — Ambulatory Visit (INDEPENDENT_AMBULATORY_CARE_PROVIDER_SITE_OTHER): Payer: Medicaid Other | Admitting: Obstetrics and Gynecology

## 2022-10-07 VITALS — BP 141/91 | HR 74

## 2022-10-07 DIAGNOSIS — Z01419 Encounter for gynecological examination (general) (routine) without abnormal findings: Secondary | ICD-10-CM

## 2022-10-07 DIAGNOSIS — R350 Frequency of micturition: Secondary | ICD-10-CM | POA: Diagnosis not present

## 2022-10-07 LAB — POCT URINALYSIS DIPSTICK
Bilirubin, UA: NEGATIVE
Blood, UA: NEGATIVE
Glucose, UA: NEGATIVE
Ketones, UA: NEGATIVE
Leukocytes, UA: NEGATIVE
Nitrite, UA: NEGATIVE
Protein, UA: NEGATIVE
Spec Grav, UA: 1.025 (ref 1.010–1.025)
Urobilinogen, UA: 0.2 E.U./dL
pH, UA: 7 (ref 5.0–8.0)

## 2022-10-07 NOTE — Progress Notes (Signed)
[facility] Urodynamics Procedure  Referring Physician: Diamantina Providence, FNP Date of Procedure: 10/07/2022  Kelsey Hines is a 52 y.o. female who presents for urodynamic evaluation. Indication(s) for study: occult SUI  Vital Signs: BP (!) 141/91   Pulse 74   LMP 09/25/2013   Laboratory Results: A catheterized urine specimen revealed:  POC urine: Clear for all components   Voiding Diary: Not Done  Procedure Timeout:  The correct patient was verified and the correct procedure was verified. The patient was in the correct position and safety precautions were reviewed based on at the patient's history.  Urodynamic Procedure A 22F dual lumen urodynamics catheter was placed under sterile conditions into the patient's bladder. A 22F catheter was placed into the rectum in order to measure abdominal pressure. EMG patches were placed in the appropriate position.  All connections were confirmed and calibrations/adjusted made. Saline was instilled into the bladder through the dual lumen catheters.  Cough/valsalva pressures were measured periodically during filling.  Patient was allowed to void.  The bladder was then emptied of its residual.  UROFLOW: Revealed a Qmax of 62mL/sec.  She voided 87 mL and had a residual of 30 mL.  It was a intermittent pattern and represented normal habits though interpretation limited due to low voided volume.  CMG: This was performed with sterile water in the sitting position at a fill rate of 20-30 mL/min.    First sensation of fullness was 47 mLs,  First urge was 55 mLs,  Strong urge was 160 mLs and  Capacity was 308 mLs  Stress incontinence was not demonstrated Highest negative Barrier CLPP was 107 cmH20 at 160 ml. Highest negative Barrier VLPP was 84 cmH20 at 160 ml.  Detrusor function was normal, with no phasic contractions seen.    Compliance:  Normal. End fill detrusor pressure was 3.1cmH20.  Calculated compliance was 99.4  [id number]  UPP: Not done   MICTURITION STUDY: Voiding was performed with reduction using scopettes in the sitting position.  Pdet at Qmax was 29.3 cm of water.  Qmax was 26.3 mL/sec.  It was a interrupted pattern.  She voided 291 mL and had a residual of 5 mL, she did have some urine spray that was lost which we assume accounts for the approximately 10ml not accounted for.  It was a volitional void, sustained detrusor contraction was present and abdominal straining was present  EMG: This was performed with patches.  She had voluntary contractions, recruitment with fill was present and urethral sphincter was not relaxed with void.  The details of the procedure with the study tracings have been scanned into EPIC.   Urodynamic Impression:  1. Sensation was increased; capacity was normal 2. Stress Incontinence was not demonstrated at normal pressures; 3. Detrusor Overactivity was not demonstrated.  4. Emptying was dysfunctional with a normal PVR, a sustained detrusor contraction present,  abdominal straining present, dyssynergic urethral sphincter activity on EMG.  Plan: - The patient will follow up  to discuss the findings and treatment options.

## 2022-10-12 LAB — CYTOLOGY - PAP
Comment: NEGATIVE
Diagnosis: NEGATIVE
High risk HPV: NEGATIVE

## 2022-11-06 ENCOUNTER — Encounter: Payer: Self-pay | Admitting: Obstetrics and Gynecology

## 2022-11-06 ENCOUNTER — Ambulatory Visit: Payer: Medicaid Other | Admitting: Obstetrics and Gynecology

## 2022-11-06 VITALS — BP 152/99 | HR 86

## 2022-11-06 DIAGNOSIS — N812 Incomplete uterovaginal prolapse: Secondary | ICD-10-CM | POA: Diagnosis not present

## 2022-11-06 DIAGNOSIS — K5904 Chronic idiopathic constipation: Secondary | ICD-10-CM

## 2022-11-06 DIAGNOSIS — N816 Rectocele: Secondary | ICD-10-CM

## 2022-11-06 DIAGNOSIS — N8110 Cystocele, unspecified: Secondary | ICD-10-CM

## 2022-11-06 DIAGNOSIS — N3281 Overactive bladder: Secondary | ICD-10-CM | POA: Diagnosis not present

## 2022-11-06 NOTE — Patient Instructions (Signed)

## 2022-11-06 NOTE — Progress Notes (Unsigned)
[facility] Return Visit  SUBJECTIVE  History of Present Illness: Kelsey Hines is a 52 y.o. female seen in follow-up for prolapse.   Reports that she has constipation and the urinary leakage worsens. Denies leakage with cough and sneeze. Does not take anything for her bowels.   Urodynamic Impression:  1. Sensation was increased; capacity was normal 2. Stress Incontinence was not demonstrated at normal pressures; 3. Detrusor Overactivity was not demonstrated.  4. Emptying was dysfunctional with a normal PVR, a sustained detrusor contraction present,  abdominal straining present, dyssynergic urethral sphincter activity on EMG.  Past Medical History: Patient  has a past medical history of Ankylosis, Arthritis, Depression, Herniated disc, Hypertension, and Scoliosis (and kyphoscoliosis), idiopathic.   Past Surgical History: She  has a past surgical history that includes Shoulder arthroscopy and Knee surgery.   Medications: She has a current medication list which includes the following prescription(s): acetaminophen, bupropion, ibuprofen, ketorolac, naproxen, ondansetron, and phentermine.   Allergies: Patient is allergic to niacin and avelox [moxifloxacin hcl in nacl].   Social History: Patient  reports that she has quit smoking. Her smoking use included cigarettes. She has never used smokeless tobacco. She reports current alcohol use. She reports that she does not use drugs.      OBJECTIVE     Physical Exam: Vitals:   11/06/22 1119  BP: (!) 152/99  Pulse: 86   Gen: No apparent distress, A&O x 3.  Detailed Urogynecologic Evaluation:  Normal external genitalia. On speculum, normal vaginal mucosa and normal appearing cervix. On bimanual, uterus is small, mobile and nontender.    POP-Q  0                                            Aa   0                                           Ba  -6                                              C   3.5                                             Gh  3                                            Pb  8.5                                            tvl   0  Ap  0                                            Bp  -6                                              D      ASSESSMENT AND PLAN    Ms. Clapham is a 51 y.o. with:  1. Prolapse of anterior vaginal wall   2. Prolapse of posterior vaginal wall   3. Uterovaginal prolapse, incomplete   4. Overactive bladder   5. Chronic idiopathic constipation     - We discussed that the leakage she is experiencing is most likely from OAB. Will monitor and see if there is improvement after improving constipation.  - For constipation, we reviewed the importance of a better bowel regimen.  We also discussed the importance of avoiding chronic straining, as it can exacerbate her pelvic floor symptoms; we discussed treating constipation and straining prior to surgery, as postoperative straining can lead to damage to the repair and recurrence of symptoms. We discussed initiating therapy with increasing fluid intake, fiber supplementation, stool softeners, and laxatives such as miralax. She will start with fiber and miralax.   Plan for surgery: Exam under anesthesia, sacrospinous hystereopexy, anterior and posterior repair, cystoscopy  - We reviewed the patient's specific anatomic and functional findings, with the assistance of diagrams, and together finalized the above procedure. The planned surgical procedures were discussed along with the surgical risks outlined below, which were also provided on a detailed handout. Additional treatment options including expectant management, conservative management, medical management were discussed where appropriate.  We reviewed the benefits and risks of each treatment option.   General Surgical Risks: For all procedures, there are risks of bleeding, infection, damage to surrounding organs  including but not limited to bowel, bladder, blood vessels, ureters and nerves, and need for further surgery if an injury were to occur. These risks are all low with minimally invasive surgery.   There are risks of numbness and weakness at any body site or buttock/rectal pain.  It is possible that baseline pain can be worsened by surgery, either with or without mesh. If surgery is vaginal, there is also a low risk of possible conversion to laparoscopy or open abdominal incision where indicated. Very rare risks include blood transfusion, blood clot, heart attack, pneumonia, or death.   There is also a risk of short-term postoperative urinary retention with need to use a catheter. About half of patients need to go home from surgery with a catheter, which is then later removed in the office. The risk of long-term need for a catheter is very low. There is also a risk of worsening of overactive bladder.   Prolapse (with or without mesh): Risk factors for surgical failure  include things that put pressure on your pelvis and the surgical repair, including obesity, chronic cough, and heavy lifting or straining (including lifting children or adults, straining on the toilet, or lifting heavy objects such as furniture or anything weighing >25 lbs. Risks of recurrence is 20-30% with vaginal native tissue repair and a less than 10% with sacrocolpopexy with mesh.     - For preop Visit:  She is required to  have a visit within 30 days of her surgery.    - Medical clearance: not required  - Anticoagulant use: No - Medicaid Hysterectomy form: No - Accepts blood transfusion: Yes - Expected length of stay: outpatient  Request sent for surgery scheduling.   Marguerita Beards, MD

## 2022-11-09 ENCOUNTER — Encounter: Payer: Self-pay | Admitting: Obstetrics and Gynecology

## 2023-01-04 ENCOUNTER — Encounter: Payer: BC Managed Care – PPO | Admitting: Obstetrics and Gynecology

## 2023-01-07 ENCOUNTER — Ambulatory Visit (INDEPENDENT_AMBULATORY_CARE_PROVIDER_SITE_OTHER): Payer: BC Managed Care – PPO | Admitting: Obstetrics and Gynecology

## 2023-01-07 ENCOUNTER — Encounter: Payer: Self-pay | Admitting: Obstetrics and Gynecology

## 2023-01-07 VITALS — BP 142/99 | HR 71 | Ht 66.54 in | Wt 167.0 lb

## 2023-01-07 DIAGNOSIS — Z01818 Encounter for other preprocedural examination: Secondary | ICD-10-CM

## 2023-01-07 MED ORDER — IBUPROFEN 600 MG PO TABS: 600.0000 mg | ORAL_TABLET | Freq: Four times a day (QID) | ORAL | 0 refills | Status: DC | PRN

## 2023-01-07 MED ORDER — ACETAMINOPHEN 500 MG PO TABS: 500.0000 mg | ORAL_TABLET | Freq: Four times a day (QID) | ORAL | 0 refills | Status: DC | PRN

## 2023-01-07 MED ORDER — OXYCODONE HCL 5 MG PO TABS: 5.0000 mg | ORAL_TABLET | ORAL | 0 refills | Status: DC | PRN

## 2023-01-07 MED ORDER — POLYETHYLENE GLYCOL 3350 17 GM/SCOOP PO POWD: 17.0000 g | Freq: Every day | ORAL | 0 refills | Status: DC

## 2023-01-07 NOTE — Progress Notes (Signed)
[facility] Pre-Operative Exam  Subjective Chief Complaint: Kelsey Hines presents for a preoperative encounter.   History of Present Illness: Kelsey Hines is a 52 y.o. female who presents for preoperative visit.  She is scheduled to undergo  Exam under anesthesia, sacrospinous hystereopexy, anterior and posterior repair, cystoscopy  on 02/01/23.  Her symptoms include pelvic organ prolapse, and she was was found to have Stage II anterior, Stage II posterior, Stage I apical prolapse.   Urodynamics showed: 1. Sensation was increased; capacity was normal 2. Stress Incontinence was not demonstrated at normal pressures; 3. Detrusor Overactivity was not demonstrated.  4. Emptying was dysfunctional with a normal PVR, a sustained detrusor contraction present,  abdominal straining present, dyssynergic urethral sphincter activity on EMG.  Past Medical History:  Diagnosis Date   Ankylosis    Arthritis    Depression    Herniated disc    Hypertension    Scoliosis (and kyphoscoliosis), idiopathic      Past Surgical History:  Procedure Laterality Date   KNEE SURGERY     SHOULDER ARTHROSCOPY      is allergic to niacin and avelox [moxifloxacin hcl in nacl].   Family History  Problem Relation Age of Onset   COPD Mother    Hypertension Mother    Diabetes Father    Hypertension Father    Kidney disease Father     Social History   Tobacco Use   Smoking status: Former    Types: Cigarettes   Smokeless tobacco: Never   Tobacco comments:    quit 2016  Vaping Use   Vaping status: Every Day   Substances: Nicotine, Flavoring  Substance Use Topics   Alcohol use: Yes    Comment: occationally   Drug use: No     Review of Systems was negative for a full 10 system review except as noted in the History of Present Illness.   Current Outpatient Medications:    acetaminophen (TYLENOL) 500 MG tablet, Take 1,000 mg by mouth every 12 (twelve) hours as needed for  moderate pain. (Patient not taking: Reported on 01/07/2023), Disp: , Rfl:    buPROPion (WELLBUTRIN XL) 300 MG 24 hr tablet, Take 300 mg by mouth daily. (Patient not taking: Reported on 01/07/2023), Disp: , Rfl:    ibuprofen (ADVIL,MOTRIN) 200 MG tablet, Take 800 mg by mouth every 12 (twelve) hours as needed for moderate pain. (Patient not taking: Reported on 01/07/2023), Disp: , Rfl:    ketorolac (TORADOL) 10 MG tablet, Take 1 tablet (10 mg total) by mouth every 6 (six) hours as needed. (Patient not taking: Reported on 01/07/2023), Disp: 20 tablet, Rfl: 0   naproxen (NAPROSYN) 500 MG tablet, Take 1 tablet (500 mg total) by mouth 2 (two) times daily with a meal. (Patient not taking: Reported on 01/07/2023), Disp: 30 tablet, Rfl: 0   ondansetron (ZOFRAN) 4 MG tablet, Take 1 tablet (4 mg total) by mouth every 8 (eight) hours as needed for nausea or vomiting. (Patient not taking: Reported on 01/07/2023), Disp: 20 tablet, Rfl: 0   phentermine (ADIPEX-P) 37.5 MG tablet, Take 37.5 mg by mouth daily before breakfast. (Patient not taking: Reported on 01/07/2023), Disp: , Rfl:    Objective Vitals:   01/07/23 1340 01/07/23 1351  BP: (!) 148/92 (!) 142/99  Pulse: 68 71    Gen: NAD CV: S1 S2 RRR Lungs: Clear to auscultation bilaterally Abd: soft, nontender   Previous Pelvic Exam showed: Normal external genitalia. On speculum, normal vaginal mucosa and normal appearing  cervix. On bimanual, uterus is small, mobile and nontender.      POP-Q   0                                            Aa   0                                           Ba   -6                                              C    3.5                                            Gh   3                                            Pb   8.5                                            tvl    0                                            Ap   0                                            Bp   -6                                              D       Assessment/ Plan  Assessment: The patient is a 52 y.o. year old scheduled to undergo Exam under anesthesia, sacrospinous hystereopexy, anterior and posterior repair, cystoscopy. Verbal consent was obtained for these procedures.  Plan: General Surgical Consent: The patient has previously been counseled on alternative treatments, and the decision by the patient and provider was to proceed with the procedure listed above.  For all procedures, there are risks of bleeding, infection, damage to surrounding organs including but not limited to bowel, bladder, blood vessels, ureters and nerves, and need for further surgery if an injury were to occur. These risks are all low with minimally invasive surgery.   There are risks of numbness and weakness at any body site or buttock/rectal pain.  It is possible that baseline pain can be worsened by surgery, either with or without mesh. If surgery is vaginal, there is also a low risk  of possible conversion to laparoscopy or open abdominal incision where indicated. Very rare risks include blood transfusion, blood clot, heart attack, pneumonia, or death.   There is also a risk of short-term postoperative urinary retention with need to use a catheter. About half of patients need to go home from surgery with a catheter, which is then later removed in the office. The risk of long-term need for a catheter is very low. There is also a risk of worsening of overactive bladder.   Prolapse (with or without mesh): Risk factors for surgical failure  include things that put pressure on your pelvis and the surgical repair, including obesity, chronic cough, and heavy lifting or straining (including lifting children or adults, straining on the toilet, or lifting heavy objects such as furniture or anything weighing >25 lbs. Risks of recurrence is 20-30% with vaginal native tissue repair and a less than 10% with sacrocolpopexy with mesh.     We discussed consent for blood  products. Risks for blood transfusion include allergic reactions, other reactions that can affect different body organs and managed accordingly, transmission of infectious diseases such as HIV or Hepatitis. However, the blood is screened. Patient consents for blood products.  Pre-operative instructions:  She was instructed to not take Aspirin/NSAIDs x 7days prior to surgery. Antibiotic prophylaxis was ordered as indicated.  Catheter use: Patient will go home with foley if needed after post-operative voiding trial.  Post-operative instructions:  She was provided with specific post-operative instructions, including precautions and signs/symptoms for which we would recommend contacting us, in addition to daytime and after-hours contact phone numbers. This was provided on a handout.   Post-operative medications: Prescriptions for motrin, tylenol, miralax, and oxycodone were sent to her pharmacy. Discussed using ibuprofen and tylenol on a schedule to limit use of narcotics.   Laboratory testing:  We will check labs: As requested by anesthesia  Preoperative clearance:  She does not require surgical clearance.    Post-operative follow-up:  A post-operative appointment will be made for 6 weeks from the date of surgery. If she needs a post-operative nurse visit for a voiding trial, that will be set up after she leaves the hospital.    Patient will call the clinic or use MyChart should anything change or any new issues arise.   Selmer Dominion, NP

## 2023-01-07 NOTE — Addendum Note (Signed)
Addended by: Selmer Dominion on: 01/07/2023 04:47 PM   Modules accepted: Orders

## 2023-01-08 NOTE — H&P (Signed)
[facility] H&P  Subjective Chief Complaint: Kelsey Hines presents for a preoperative encounter.   History of Present Illness: Kelsey Hines is a 52 y.o. female who presents for preoperative visit.  She is scheduled to undergo  Exam under anesthesia, sacrospinous hystereopexy, anterior and posterior repair, cystoscopy  on 02/01/23.  Her symptoms include pelvic organ prolapse, and she was was found to have Stage II anterior, Stage II posterior, Stage I apical prolapse.   Urodynamics showed: 1. Sensation was increased; capacity was normal 2. Stress Incontinence was not demonstrated at normal pressures; 3. Detrusor Overactivity was not demonstrated.  4. Emptying was dysfunctional with a normal PVR, a sustained detrusor contraction present,  abdominal straining present, dyssynergic urethral sphincter activity on EMG.  Past Medical History:  Diagnosis Date   Ankylosis    Arthritis    Depression    Herniated disc    Hypertension    Scoliosis (and kyphoscoliosis), idiopathic      Past Surgical History:  Procedure Laterality Date   KNEE SURGERY     SHOULDER ARTHROSCOPY      is allergic to niacin and avelox [moxifloxacin hcl in nacl].   Family History  Problem Relation Age of Onset   COPD Mother    Hypertension Mother    Diabetes Father    Hypertension Father    Kidney disease Father     Social History   Tobacco Use   Smoking status: Former    Types: Cigarettes   Smokeless tobacco: Never   Tobacco comments:    quit 2016  Vaping Use   Vaping status: Every Day   Substances: Nicotine, Flavoring  Substance Use Topics   Alcohol use: Yes    Comment: occationally   Drug use: No     Review of Systems was negative for a full 10 system review except as noted in the History of Present Illness.  No current facility-administered medications for this encounter.  Current Outpatient Medications:    acetaminophen (TYLENOL) 500 MG tablet, Take 1,000 mg by  mouth every 12 (twelve) hours as needed for moderate pain. (Patient not taking: Reported on 01/07/2023), Disp: , Rfl:    acetaminophen (TYLENOL) 500 MG tablet, Take 1 tablet (500 mg total) by mouth every 6 (six) hours as needed (pain)., Disp: 30 tablet, Rfl: 0   buPROPion (WELLBUTRIN XL) 300 MG 24 hr tablet, Take 300 mg by mouth daily. (Patient not taking: Reported on 01/07/2023), Disp: , Rfl:    ibuprofen (ADVIL) 600 MG tablet, Take 1 tablet (600 mg total) by mouth every 6 (six) hours as needed., Disp: 30 tablet, Rfl: 0   ibuprofen (ADVIL,MOTRIN) 200 MG tablet, Take 800 mg by mouth every 12 (twelve) hours as needed for moderate pain. (Patient not taking: Reported on 01/07/2023), Disp: , Rfl:    ketorolac (TORADOL) 10 MG tablet, Take 1 tablet (10 mg total) by mouth every 6 (six) hours as needed. (Patient not taking: Reported on 01/07/2023), Disp: 20 tablet, Rfl: 0   naproxen (NAPROSYN) 500 MG tablet, Take 1 tablet (500 mg total) by mouth 2 (two) times daily with a meal. (Patient not taking: Reported on 01/07/2023), Disp: 30 tablet, Rfl: 0   ondansetron (ZOFRAN) 4 MG tablet, Take 1 tablet (4 mg total) by mouth every 8 (eight) hours as needed for nausea or vomiting. (Patient not taking: Reported on 01/07/2023), Disp: 20 tablet, Rfl: 0   oxyCODONE (OXY IR/ROXICODONE) 5 MG immediate release tablet, Take 1 tablet (5 mg total) by mouth every 4 (  four) hours as needed for severe pain., Disp: 15 tablet, Rfl: 0   phentermine (ADIPEX-P) 37.5 MG tablet, Take 37.5 mg by mouth daily before breakfast. (Patient not taking: Reported on 01/07/2023), Disp: , Rfl:    polyethylene glycol powder (GLYCOLAX/MIRALAX) 17 GM/SCOOP powder, Take 17 g by mouth daily. Drink 17g (1 scoop) dissolved in water per day., Disp: 255 g, Rfl: 0   Objective There were no vitals filed for this visit.   Gen: NAD CV: S1 S2 RRR Lungs: Clear to auscultation bilaterally Abd: soft, nontender   Previous Pelvic Exam showed: Normal external  genitalia. On speculum, normal vaginal mucosa and normal appearing cervix. On bimanual, uterus is small, mobile and nontender.      POP-Q   0                                            Aa   0                                           Ba   -6                                              C    3.5                                            Gh   3                                            Pb   8.5                                            tvl    0                                            Ap   0                                            Bp   -6                                              D      Assessment/ Plan  Assessment: The patient is a 52 y.o. year old scheduled to undergo Exam under anesthesia, sacrospinous hystereopexy, anterior and posterior repair, cystoscopy. year old scheduled to undergo Exam under anesthesia, sacrospinous hystereopexy, anterior and posterior repair, cystoscopy. Verbal consent was obtained for these procedures.

## 2023-01-21 ENCOUNTER — Encounter (HOSPITAL_BASED_OUTPATIENT_CLINIC_OR_DEPARTMENT_OTHER): Payer: Self-pay | Admitting: Obstetrics and Gynecology

## 2023-01-21 NOTE — Progress Notes (Signed)
Spoke w/ via phone for pre-op interview--- [facility] needs dos----   NONE      Lab results------ COVID test -----patient states asymptomatic no test needed Arrive at -------0900 NPO after MN NO Solid Food.  Clear liquids from MN until---0800 Med rec completed Medications to take morning of surgery -----Wellbutrin Diabetic medication ----- Patient instructed no nail polish to be worn day of surgery Patient instructed to bring photo id and insurance card day of surgery Patient aware to have Driver (ride ) / caregiver    for 24 hours after surgery - Daughter [name] Patient Special Instructions ----- Pre-Op special Instructions ----- Patient verbalized understanding of instructions that were given at this phone interview. Patient denies chest pain, sob, fever, cough at the interview.

## 2023-01-30 ENCOUNTER — Encounter (HOSPITAL_COMMUNITY): Payer: Self-pay | Admitting: Anesthesiology

## 2023-02-01 ENCOUNTER — Ambulatory Visit (HOSPITAL_BASED_OUTPATIENT_CLINIC_OR_DEPARTMENT_OTHER)
Admission: RE | Admit: 2023-02-01 | Payer: Medicaid Other | Source: Home / Self Care | Admitting: Obstetrics and Gynecology

## 2023-02-01 ENCOUNTER — Telehealth: Payer: Self-pay

## 2023-02-01 SURGERY — ANTERIOR AND POSTERIOR REPAIR WITH SACROSPINOUS FIXATION
Anesthesia: General

## 2023-02-01 NOTE — Plan of Care (Signed)
 [facility] Tonsillectomy/Adenoidectomy, Postoperative PEDS care plan entered in error.

## 2023-02-01 NOTE — Telephone Encounter (Signed)
Kelsey Hines is a 52 y.o. female called in for cancel surgery. Pt said shehas a fever and IBS issues and is bloated.

## 2023-02-01 NOTE — Telephone Encounter (Signed)
Surgery cancelled

## 2023-03-09 ENCOUNTER — Encounter: Payer: BC Managed Care – PPO | Admitting: Family Medicine

## 2023-03-15 ENCOUNTER — Encounter: Payer: BC Managed Care – PPO | Admitting: Obstetrics and Gynecology

## 2023-03-17 ENCOUNTER — Encounter: Payer: BC Managed Care – PPO | Admitting: Obstetrics and Gynecology

## 2023-03-22 ENCOUNTER — Encounter: Payer: Medicaid Other | Admitting: Obstetrics and Gynecology

## 2023-05-05 ENCOUNTER — Other Ambulatory Visit: Payer: Self-pay | Admitting: *Deleted

## 2023-05-05 DIAGNOSIS — N812 Incomplete uterovaginal prolapse: Secondary | ICD-10-CM

## 2023-05-05 NOTE — Progress Notes (Signed)
Pt cancelled surgery 02/01/23 and wanted to reschedule.

## 2023-05-06 ENCOUNTER — Encounter: Payer: Medicaid Other | Admitting: Obstetrics and Gynecology

## 2023-06-08 ENCOUNTER — Ambulatory Visit (HOSPITAL_BASED_OUTPATIENT_CLINIC_OR_DEPARTMENT_OTHER)

## 2023-06-08 ENCOUNTER — Encounter (HOSPITAL_BASED_OUTPATIENT_CLINIC_OR_DEPARTMENT_OTHER): Payer: Self-pay | Admitting: Student

## 2023-06-08 ENCOUNTER — Ambulatory Visit (HOSPITAL_BASED_OUTPATIENT_CLINIC_OR_DEPARTMENT_OTHER): Admitting: Student

## 2023-06-08 DIAGNOSIS — M25561 Pain in right knee: Secondary | ICD-10-CM | POA: Diagnosis not present

## 2023-06-08 DIAGNOSIS — G8929 Other chronic pain: Secondary | ICD-10-CM | POA: Diagnosis not present

## 2023-06-08 DIAGNOSIS — M1711 Unilateral primary osteoarthritis, right knee: Secondary | ICD-10-CM

## 2023-06-08 MED ORDER — LIDOCAINE HCL 1 % IJ SOLN
4.0000 mL | INTRAMUSCULAR | Status: AC | PRN
Start: 2023-06-08 — End: 2023-06-08
  Administered 2023-06-08: 4 mL

## 2023-06-08 MED ORDER — TRIAMCINOLONE ACETONIDE 40 MG/ML IJ SUSP
2.0000 mL | INTRAMUSCULAR | Status: AC | PRN
Start: 2023-06-08 — End: 2023-06-08
  Administered 2023-06-08: 2 mL via INTRA_ARTICULAR

## 2023-06-08 NOTE — Progress Notes (Signed)
 Chief Complaint: Right knee pain     History of Present Illness:   Discussed the use of AI scribe software for clinical note transcription with the patient, who gave verbal consent to proceed.  Kelsey Hines is a 53 year old female with known knee osteoarthritis who presents with right knee pain. She experiences persistent and severe right knee pain, described as 'stabbing' and 'piercing', primarily located on the inside of the knee and sometimes radiating down the leg. The pain has been ongoing with episodes of exacerbation, occasionally causing her knee to buckle, which may have led to a recent fall in her garden tub. She has a history of knee osteoarthritis, previously diagnosed as 'bone on bone' by an orthopedist. She has received injections in the past, which provided relief, but the pain has returned. She attempted to manage her symptoms with over-the-counter treatments like capsaicin patches, but these have not been effective. Participating in physical activity has been challenging due to her knee pain. 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 She wants to remain functional and active despite her condition.     Surgical History:   None  PMH/PSH/Family History/Social History/Meds/Allergies:    Past Medical History:  Diagnosis Date   Ankylosis    Arthritis    Depression    Herniated disc    Hypertension    Scoliosis (and kyphoscoliosis), idiopathic    Past Surgical History:  Procedure Laterality Date   KNEE SURGERY     SHOULDER ARTHROSCOPY     Social History   Socioeconomic History   Marital status: Legally Separated    Spouse name: Not on file   Number of children: Not on file   Years of education: Not on file   Highest education level: Not on file  Occupational History   Not on file  Tobacco Use   Smoking status: Former    Types: Cigarettes   Smokeless tobacco: Never    Tobacco comments:    quit 2016  Vaping Use   Vaping status: Every Day   Substances: Nicotine, Flavoring  Substance and Sexual Activity   Alcohol use: Yes    Comment: occationally   Drug use: No   Sexual activity: Not Currently  Other Topics Concern   Not on file  Social History Narrative   Not on file   Social Drivers of Health   Financial Resource Strain: Not on file  Food Insecurity: Not on file  Transportation Needs: Not on file  Physical Activity: Not on file  Stress: Not on file  Social Connections: Not on file   Family History  Problem Relation Age of Onset   COPD Mother    Hypertension Mother    Diabetes Father    Hypertension Father    Kidney disease Father    Allergies  Allergen Reactions   Niacin     Does not like the flush feeling   Avelox [Moxifloxacin Hcl In Nacl] Rash   Current Outpatient Medications  Medication Sig Dispense Refill   acetaminophen (TYLENOL) 500 MG tablet Take 1,000 mg by mouth every 12 (twelve) hours as needed for moderate pain. (Patient not taking: Reported on 01/07/2023)     acetaminophen (TYLENOL) 500 MG tablet Take 1 tablet (500 mg total) by mouth every 6 (  six) hours as needed (pain). 30 tablet 0   Ascorbic Acid (VITAMIN C) 100 MG tablet Take 100 mg by mouth daily.     Aspirin-Acetaminophen-Caffeine (GOODYS EXTRA STRENGTH PO) Take by mouth.     buPROPion (WELLBUTRIN XL) 300 MG 24 hr tablet Take 300 mg by mouth daily.     ibuprofen (ADVIL) 600 MG tablet Take 1 tablet (600 mg total) by mouth every 6 (six) hours as needed. 30 tablet 0   ibuprofen (ADVIL,MOTRIN) 200 MG tablet Take 800 mg by mouth every 12 (twelve) hours as needed for moderate pain. (Patient not taking: Reported on 01/07/2023)     ketorolac (TORADOL) 10 MG tablet Take 1 tablet (10 mg total) by mouth every 6 (six) hours as needed. (Patient not taking: Reported on 01/07/2023) 20 tablet 0   naproxen (NAPROSYN) 500 MG tablet Take 1 tablet (500 mg total) by mouth 2 (two) times  daily with a meal. (Patient not taking: Reported on 01/07/2023) 30 tablet 0   ondansetron (ZOFRAN) 4 MG tablet Take 1 tablet (4 mg total) by mouth every 8 (eight) hours as needed for nausea or vomiting. (Patient not taking: Reported on 01/07/2023) 20 tablet 0   oxyCODONE (OXY IR/ROXICODONE) 5 MG immediate release tablet Take 1 tablet (5 mg total) by mouth every 4 (four) hours as needed for severe pain. 15 tablet 0   phentermine (ADIPEX-P) 37.5 MG tablet Take 37.5 mg by mouth daily before breakfast. (Patient not taking: Reported on 01/07/2023)     polyethylene glycol powder (GLYCOLAX/MIRALAX) 17 GM/SCOOP powder Take 17 g by mouth daily. Drink 17g (1 scoop) dissolved in water per day. 255 g 0   zinc gluconate 50 MG tablet Take 50 mg by mouth daily.     No current facility-administered medications for this visit.   No results found.  Review of Systems:   A ROS was performed including pertinent positives and negatives as documented in the HPI.  Physical Exam :   Constitutional: NAD and appears stated age Neurological: Alert and oriented Psych: Appropriate affect and cooperative Last menstrual period 09/25/2013.   Comprehensive Musculoskeletal Exam:    Right knee exam demonstrates tenderness to palpation over bilateral joint lines.  Moderate valgus deformity.  Minimal to mild effusion present.  Active range of motion from 5 to 120 degrees with palpable crepitus.  No laxity with varus or valgus stress.  No overlying erythema or warmth.  Imaging:   Xray (right knee 4 views): Advanced tricompartmental osteoarthritis that is bone-on-bone in the lateral compartment.  Diffuse osteophytes.   I personally reviewed and interpreted the radiographs.   Assessment:    Osteoarthritis of the right knee   Chronic osteoarthritis with bone-on-bone contact is severe in the lateral compartment, causing significant pain and affecting mobility. A recent fall likely exacerbated the condition. She prefers to  delay knee replacement as much as possible, although understands that she will likely require this in the future. Administer a cortisone injection to reduce inflammation and pain. Encourage low-impact exercises such as walking, biking, and using the elliptical. Advise knee replacement only when pain becomes unmanageable and other treatments fail.     Plan :    -Right knee cortisone injection performed today -Return to clinic as needed    Procedure Note  Patient: Kelsey Hines             Date of Birth: 15-Sep-1970           MRN: [id number]  Visit Date: 06/08/2023  Procedures: Visit Diagnoses:  1. Unilateral primary osteoarthritis, right knee     Large Joint Inj: R knee on 06/08/2023 5:17 PM Indications: pain Details: 22 G 1.5 in needle, anteromedial approach Medications: 4 mL lidocaine 1 %; 2 mL triamcinolone acetonide 40 MG/ML Outcome: tolerated well, no immediate complications Procedure, treatment alternatives, risks and benefits explained, specific risks discussed. Consent was given by the patient. Immediately prior to procedure a time out was called to verify the correct patient, procedure, equipment, support staff and site/side marked as required. Patient was prepped and draped in the usual sterile fashion.       I personally saw and evaluated the patient, and participated in the management and treatment plan.  Hazle Nordmann, PA-C Orthopedics

## 2023-08-12 ENCOUNTER — Encounter: Payer: Self-pay | Admitting: Student in an Organized Health Care Education/Training Program

## 2023-08-12 ENCOUNTER — Ambulatory Visit: Admitting: Student in an Organized Health Care Education/Training Program

## 2023-08-12 VITALS — BP 160/88 | HR 87 | Ht 66.54 in | Wt 142.0 lb

## 2023-08-12 DIAGNOSIS — Z114 Encounter for screening for human immunodeficiency virus [HIV]: Secondary | ICD-10-CM | POA: Diagnosis not present

## 2023-08-12 DIAGNOSIS — R634 Abnormal weight loss: Secondary | ICD-10-CM | POA: Diagnosis not present

## 2023-08-12 DIAGNOSIS — I10 Essential (primary) hypertension: Secondary | ICD-10-CM | POA: Diagnosis not present

## 2023-08-12 DIAGNOSIS — F321 Major depressive disorder, single episode, moderate: Secondary | ICD-10-CM | POA: Diagnosis not present

## 2023-08-12 LAB — URINALYSIS, ROUTINE W REFLEX MICROSCOPIC
Bilirubin Urine: NEGATIVE
Hgb urine dipstick: NEGATIVE
Ketones, ur: NEGATIVE
Leukocytes,Ua: NEGATIVE
Nitrite: NEGATIVE
RBC / HPF: NONE SEEN (ref 0–?)
Specific Gravity, Urine: 1.01 (ref 1.000–1.030)
Total Protein, Urine: NEGATIVE
Urine Glucose: NEGATIVE
Urobilinogen, UA: 0.2 (ref 0.0–1.0)
WBC, UA: NONE SEEN (ref 0–?)
pH: 6 (ref 5.0–8.0)

## 2023-08-12 LAB — CBC WITH DIFFERENTIAL/PLATELET
Basophils Absolute: 0 10*3/uL (ref 0.0–0.1)
Basophils Relative: 0.6 % (ref 0.0–3.0)
Eosinophils Absolute: 0.1 10*3/uL (ref 0.0–0.7)
Eosinophils Relative: 1.4 % (ref 0.0–5.0)
HCT: 35.9 % — ABNORMAL LOW (ref 36.0–46.0)
Hemoglobin: 11.9 g/dL — ABNORMAL LOW (ref 12.0–15.0)
Lymphocytes Relative: 20.8 % (ref 12.0–46.0)
Lymphs Abs: 0.9 10*3/uL (ref 0.7–4.0)
MCHC: 33.3 g/dL (ref 30.0–36.0)
MCV: 90.8 fl (ref 78.0–100.0)
Monocytes Absolute: 0.3 10*3/uL (ref 0.1–1.0)
Monocytes Relative: 8 % (ref 3.0–12.0)
Neutro Abs: 3 10*3/uL (ref 1.4–7.7)
Neutrophils Relative %: 69.2 % (ref 43.0–77.0)
Platelets: 341 10*3/uL (ref 150.0–400.0)
RBC: 3.95 Mil/uL (ref 3.87–5.11)
RDW: 15.4 % (ref 11.5–15.5)
WBC: 4.3 10*3/uL (ref 4.0–10.5)

## 2023-08-12 LAB — COMPREHENSIVE METABOLIC PANEL WITH GFR
ALT: 17 U/L (ref 0–35)
AST: 18 U/L (ref 0–37)
Albumin: 4.3 g/dL (ref 3.5–5.2)
Alkaline Phosphatase: 54 U/L (ref 39–117)
BUN: 13 mg/dL (ref 6–23)
CO2: 31 meq/L (ref 19–32)
Calcium: 8.9 mg/dL (ref 8.4–10.5)
Chloride: 103 meq/L (ref 96–112)
Creatinine, Ser: 0.77 mg/dL (ref 0.40–1.20)
GFR: 89.52 mL/min (ref >60.00)
Glucose, Bld: 74 mg/dL (ref 70–99)
Potassium: 3.7 meq/L (ref 3.5–5.1)
Sodium: 140 meq/L (ref 135–145)
Total Bilirubin: 0.4 mg/dL (ref 0.2–1.2)
Total Protein: 6.8 g/dL (ref 6.0–8.3)

## 2023-08-12 LAB — LIPID PANEL
Cholesterol: 209 mg/dL — ABNORMAL HIGH (ref 0–200)
HDL: 92.8 mg/dL (ref >39.00)
LDL Cholesterol: 109 mg/dL — ABNORMAL HIGH (ref 0–99)
NonHDL: 116.61
Total CHOL/HDL Ratio: 2
Triglycerides: 38 mg/dL (ref 0.0–149.0)
VLDL: 7.6 mg/dL (ref 0.0–40.0)

## 2023-08-12 LAB — MICROALBUMIN / CREATININE URINE RATIO
Creatinine,U: 57.4 mg/dL
Microalb Creat Ratio: UNDETERMINED mg/g (ref 0.0–30.0)
Microalb, Ur: <0.7 mg/dL

## 2023-08-12 LAB — HEMOGLOBIN A1C: Hgb A1c MFr Bld: 5.5 % (ref 4.6–6.5)

## 2023-08-12 LAB — TSH: TSH: 0.51 u[IU]/mL (ref 0.35–5.50)

## 2023-08-12 MED ORDER — LOSARTAN POTASSIUM-HCTZ 50-12.5 MG PO TABS
1.0000 | ORAL_TABLET | Freq: Every day | ORAL | 1 refills | Status: AC
Start: 2023-08-12 — End: ?

## 2023-08-12 MED ORDER — DULOXETINE HCL 30 MG PO CPEP: 30.0000 mg | ORAL_CAPSULE | Freq: Every day | ORAL | 1 refills | Status: DC

## 2023-08-12 MED ORDER — HYDROXYZINE PAMOATE 25 MG PO CAPS: 25.0000 mg | ORAL_CAPSULE | Freq: Three times a day (TID) | ORAL | 1 refills | Status: DC | PRN

## 2023-08-12 NOTE — Assessment & Plan Note (Signed)
 New problem, severe unintentional weight loss over the last 6 months.  Weight today is 142 pounds BMI 22.  Office weight in July of last year was 177 pounds.  So we do have a documented 35 pound weight loss over this year.  Which is roughly 20% of her body weight.  I would consider this abnormal.  No use of weight loss medications recently.  She has risk for underlying malignancy because of a 15 pack year history of tobacco use.  Still vapes.  Will get CT chest to rule out lung cancer.  She does have lower abdominal discomfort and fullness, early satiety, so will get CT of the abdomen pelvis to rule out GI or GU malignancy.  Will get labs today as well to look for other organ dysfunction.  Follow-up in 4 weeks for weight recheck.  We did talk about how mood can impact weight, especially appetite.  Hopefully as we treat the mood disorder we may see improvements in weight as well.
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
## 2023-08-12 NOTE — Assessment & Plan Note (Signed)
 New diagnosis, blood pressure elevated at 160/88 which is consistent with stage II hypertension.  Will start 2 agents with losartan and HCTZ.

## 2023-08-13 LAB — HIV ANTIBODY (ROUTINE TESTING W REFLEX): HIV 1&2 Ab, 4th Generation: NONREACTIVE

## 2023-08-23 LAB — IFE, PE AND FLC, SERUM
Albumin SerPl Elph-Mcnc: 3.9 g/dL (ref 2.9–4.4)
Albumin/Glob SerPl: 1.6 (ref 0.7–1.7)
Alpha 1: 0.3 g/dL (ref 0.0–0.4)
Alpha2 Glob SerPl Elph-Mcnc: 0.6 g/dL (ref 0.4–1.0)
B-Globulin SerPl Elph-Mcnc: 0.8 g/dL (ref 0.7–1.3)
Gamma Glob SerPl Elph-Mcnc: 0.8 g/dL (ref 0.4–1.8)
Globulin, Total: 2.5 g/dL (ref 2.2–3.9)
Ig Kappa Free Light Chain: 15.1 mg/L (ref 3.3–19.4)
Ig Lambda Free Light Chain: 13.3 mg/L (ref 5.7–26.3)
IgA/Immunoglobulin A, Serum: 98 mg/dL (ref 87–352)
IgG (Immunoglobin G), Serum: 923 mg/dL (ref 586–1602)
IgM (Immunoglobulin M), Srm: 65 mg/dL (ref 26–217)
KAPPA/LAMBDA RATIO: 1.14 (ref 0.26–1.65)
Total Protein: 6.4 g/dL (ref 6.0–8.5)

## 2023-08-24 ENCOUNTER — Telehealth: Payer: Self-pay | Admitting: Student in an Organized Health Care Education/Training Program

## 2023-08-24 NOTE — Telephone Encounter (Signed)
 Auth required - this has been completed   [id number] - APPROVED Auth # [id number]  Date(s) of Service: 08/24/2023 - 10/29/23 Additionally Approved Codes: 78295, 62130, 86578   I called the patient and made her aware that she is okay to have this CT Abd done. She still needs to schedule the CT Chest.  Patient is going to call to see if they can add on the CT Chest the same day that she is already scheduled for the Abd and if not then she will have the Chest done a different day. She said that she is more concerned with the Abd since she is currently symptomatic

## 2023-08-24 NOTE — Telephone Encounter (Signed)
 FYI: Missing auth-see crm below  Copied from CRM [id number]. Topic: Clinical - Medication Prior Auth >> Aug 24, 2023  3:51 PM Shereese L wrote: Reason for CRM: [facility] stated that 1 of her Siegfried Dress is missing which is the Abdomen and pelvis prior auth not received was advised by office to proceed with what's approved and the other will be sent and patient will be contacted

## 2023-08-26 ENCOUNTER — Ambulatory Visit (HOSPITAL_COMMUNITY)
Admission: RE | Admit: 2023-08-26 | Discharge: 2023-08-26 | Disposition: A | Discharge status: Home / Self Care | Source: Ambulatory Visit | Attending: Student in an Organized Health Care Education/Training Program | Admitting: Student in an Organized Health Care Education/Training Program

## 2023-08-26 DIAGNOSIS — R634 Abnormal weight loss: Secondary | ICD-10-CM | POA: Insufficient documentation

## 2023-08-26 MED ORDER — IOHEXOL 300 MG/ML  SOLN
100.0000 mL | Freq: Once | INTRAMUSCULAR | Status: AC | PRN
  Administered 2023-08-26: 100 mL via INTRAVENOUS

## 2023-08-27 ENCOUNTER — Ambulatory Visit: Payer: Self-pay | Admitting: Student in an Organized Health Care Education/Training Program

## 2023-08-27 DIAGNOSIS — E041 Nontoxic single thyroid nodule: Secondary | ICD-10-CM

## 2023-09-09 ENCOUNTER — Ambulatory Visit: Admitting: Student in an Organized Health Care Education/Training Program

## 2023-09-22 ENCOUNTER — Ambulatory Visit (HOSPITAL_BASED_OUTPATIENT_CLINIC_OR_DEPARTMENT_OTHER)

## 2023-09-22 ENCOUNTER — Ambulatory Visit: Payer: Self-pay

## 2023-09-22 NOTE — Telephone Encounter (Signed)
 FYI Only or Action Required?: FYI only for provider.  Patient was last seen in primary care on 08/12/2023 by Ether Hercules, MD. Called Nurse Triage reporting Abdominal Cramping and Anxiety. Symptoms began several days ago. Interventions attempted: Rest, hydration, or home remedies. Symptoms are: unchanged.  Triage Disposition: See Physician Within 24 Hours  Patient/caregiver understands and will follow disposition?: Yes  Copied from CRM [id number]. Topic: Clinical - Red Word Triage >> Sep 22, 2023  4:33 PM [place] J wrote: [facility] that prompted transfer to Nurse Triage: Patient is in mental distress. She is having heightened anxiety and also fell and bruised her right leg. No opened wound is found. She also feels like her toes feel numb and it's like they're going through a burning sensation.  She stated she is having abdominal pain as well and thinks she found a tape worm. Reason for Disposition  [1] MODERATE pain (e.g., interferes with normal activities) AND [2] pain comes and goes (cramps) AND [3] present > 24 hours  (Exception: Pain with Vomiting or Diarrhea - see that Guideline.)  Patient sounds very upset or troubled to the triager  Answer Assessment - Initial Assessment Questions 1. LOCATION: Where does it hurt?      Lower abdominal pain 2. RADIATION: Does the pain shoot anywhere else? (e.g., chest, back)     non radiating 3. ONSET: When did the pain begin? (e.g., minutes, hours or days ago)      Going on for a year 4. SUDDEN: Gradual or sudden onset?     gradual 5. PATTERN Does the pain come and go, or is it constant?    - If it comes and goes: How long does it last? Do you have pain now?     (Note: Comes and goes means the pain is intermittent. It goes away completely between bouts.)    - If constant: Is it getting better, staying the same, or getting worse?      (Note: Constant means the pain never goes away completely; most serious pain is constant and gets  worse.)      Comes and goes 6. SEVERITY: How bad is the pain?  (e.g., Scale 1-10; mild, moderate, or severe)    - MILD (1-3): Doesn't interfere with normal activities, abdomen soft and not tender to touch.     - MODERATE (4-7): Interferes with normal activities or awakens from sleep, abdomen tender to touch.     - SEVERE (8-10): Excruciating pain, doubled over, unable to do any normal activities.       Mild to moderate pain currently 7. RECURRENT SYMPTOM: Have you ever had this type of stomach pain before? If Yes, ask: When was the last time? and What happened that time?      yes 8. CAUSE: What do you think is causing the stomach pain?     unsure 9. RELIEVING/AGGRAVATING FACTORS: What makes it better or worse? (e.g., antacids, bending or twisting motion, bowel movement)     Bowel movement 10. OTHER SYMPTOMS: Do you have any other symptoms? (e.g., back pain, diarrhea, fever, urination pain, vomiting)       Anxiety due to living situation.  Answer Assessment - Initial Assessment Questions 1. CONCERN: Did anything happen that prompted you to call today?      Increased stress with living situation 2. ANXIETY SYMPTOMS: Can you describe how you (your loved one; patient) have been feeling? (e.g., tense, restless, panicky, anxious, keyed up, overwhelmed, sense of impending doom).  Anxious, nervous 3. ONSET: How long have you been feeling this way? (e.g., hours, days, weeks)     Started in the last couple of days 4. SEVERITY: How would you rate the level of anxiety? (e.g., 0 - 10; or mild, moderate, severe).     moderate 5. FUNCTIONAL IMPAIRMENT: How have these feelings affected your ability to do daily activities? Have you had more difficulty than usual doing your normal daily activities? (e.g., getting better, same, worse; self-care, school, work, interactions)     Hard to get things done 6. HISTORY: Have you felt this way before? Have you ever been diagnosed with  an anxiety problem in the past? (e.g., generalized anxiety disorder, panic attacks, PTSD). If Yes, ask: How was this problem treated? (e.g., medicines, counseling, etc.)     no 7. RISK OF HARM - SUICIDAL IDEATION: Do you ever have thoughts of hurting or killing yourself? If Yes, ask:  Do you have these feelings now? Do you have a plan on how you would do this?     no 8. TREATMENT:  What has been done so far to treat this anxiety? (e.g., medicines, relaxation strategies). What has helped?     No treatment 9. TREATMENT - THERAPIST: Do you have a counselor or therapist? Name?     N/A 10. POTENTIAL TRIGGERS: Do you drink caffeinated beverages (e.g., coffee, colas, teas), and how much daily? Do you drink alcohol or use any drugs? Have you started any new medicines recently?       Yes to caffeniated beverages. No drugs or alcohol 11. PATIENT SUPPORT: Who is with you now? Who do you live with? Do you have family or friends who you can talk to?        yes 12. OTHER SYMPTOMS: Do you have any other symptoms? (e.g., feeling depressed, trouble concentrating, trouble sleeping, trouble breathing, palpitations or fast heartbeat, chest pain, sweating, nausea, or diarrhea)       Abdominal pain  Protocols used: Abdominal Pain - Female-A-AH, Anxiety and Panic Attack-A-AH

## 2023-09-23 ENCOUNTER — Ambulatory Visit: Payer: Self-pay

## 2023-09-23 ENCOUNTER — Ambulatory Visit: Admitting: Family Medicine

## 2023-09-23 NOTE — Telephone Encounter (Signed)
 FYI Only or Action Required?: FYI only for provider.  Patient was last seen in primary care on 08/12/2023 by Ether Hercules, MD. Called Nurse Triage reporting No chief complaint on file.. Symptoms began yesterday. Interventions attempted: Rest, hydration, or home remedies and Ice/heat application. Symptoms are: unchanged.  Triage Disposition: See HCP Within 4 Hours (Or PCP Triage)-to Urgent Care  Patient/caregiver understands and will follow disposition?: Yes  Copied from CRM [id number]. Topic: Clinical - Red Word Triage >> Sep 23, 2023  4:16 PM [place] E wrote: [facility] that prompted transfer to Nurse Triage: Abdominal and knee pain, limping. Rated pain 10 out of 10 today. Reason for Disposition  [1] SEVERE pain (e.g., excruciating, unable to walk) AND [2] not improved after 2 hours of pain medicine  Answer Assessment - Initial Assessment Questions 1. LOCATION and RADIATION: Where is the pain located?      Left knee 2. QUALITY: What does the pain feel like?  (e.g., sharp, dull, aching, burning)     Sharp deep ache 3. SEVERITY: How bad is the pain? What does it keep you from doing?   (Scale 1-10; or mild, moderate, severe)   -  MILD (1-3): doesn't interfere with normal activities    -  MODERATE (4-7): interferes with normal activities (e.g., work or school) or awakens from sleep, limping    -  SEVERE (8-10): excruciating pain, unable to do any normal activities, unable to walk     10 4. ONSET: When did the pain start? Does it come and go, or is it there all the time?     Started yesterday 5. RECURRENT: Have you had this pain before? If Yes, ask: When, and what happened then?     yes 6. SETTING: Has there been any recent work, exercise or other activity that involved that part of the body?      no 7. AGGRAVATING FACTORS: What makes the knee pain worse? (e.g., walking, climbing stairs, running)     activity 8. ASSOCIATED SYMPTOMS: Is there any swelling or  redness of the knee?     swollen 9. OTHER SYMPTOMS: Do you have any other symptoms? (e.g., chest pain, difficulty breathing, fever, calf pain)     No  Patient was triaged yesterday and an appointment was made. Patient went to wrong location and missed her scheduled appointment. Patient is recommended to urgent care for current symptoms.  Protocols used: Knee Pain-A-AH

## 2023-09-24 NOTE — Telephone Encounter (Signed)
 FYI pt missed appt at [facility] 09/23/23

## 2023-10-03 ENCOUNTER — Emergency Department (HOSPITAL_COMMUNITY)

## 2023-10-03 ENCOUNTER — Emergency Department (HOSPITAL_COMMUNITY)
Admission: EM | Admit: 2023-10-03 | Discharge: 2023-10-03 | Disposition: A | Discharge status: Home / Self Care | Attending: Student | Admitting: Student

## 2023-10-03 ENCOUNTER — Other Ambulatory Visit: Payer: Self-pay

## 2023-10-03 ENCOUNTER — Encounter (HOSPITAL_COMMUNITY): Payer: Self-pay | Admitting: Emergency Medicine

## 2023-10-03 DIAGNOSIS — M25561 Pain in right knee: Secondary | ICD-10-CM | POA: Diagnosis present

## 2023-10-03 DIAGNOSIS — M25551 Pain in right hip: Secondary | ICD-10-CM | POA: Diagnosis not present

## 2023-10-03 DIAGNOSIS — I10 Essential (primary) hypertension: Secondary | ICD-10-CM | POA: Diagnosis not present

## 2023-10-03 DIAGNOSIS — Z79899 Other long term (current) drug therapy: Secondary | ICD-10-CM | POA: Diagnosis not present

## 2023-10-03 MED ORDER — NAPROXEN 500 MG PO TABS: 500.0000 mg | ORAL_TABLET | Freq: Two times a day (BID) | ORAL | 0 refills | Status: AC

## 2023-10-03 MED ORDER — KETOROLAC TROMETHAMINE 15 MG/ML IJ SOLN
15.0000 mg | Freq: Once | INTRAMUSCULAR | Status: AC
  Administered 2023-10-03: 15 mg via INTRAMUSCULAR
  Filled 2023-10-03: qty 1

## 2023-10-03 NOTE — ED Provider Notes (Signed)
 [place] EMERGENCY DEPARTMENT AT [facility] Provider Note   CSN: [id number] Arrival date & time: 10/03/23  8091     Patient presents with: Leg Pain   Kelsey Hines is a 53 y.o. female with past medical history of HTN, GAD, scoliosis presents to the Emergency Department for evaluation of right knee and hip pain following falling through her porch wood floor 1 month ago.  She reports that she was feeding her dogs when the rotten wood gave way and she fell into the porch.  No other injuries, head injury, LOC.  Has been taking Tylenol  and Goody powder for pain without much relief.     Leg Pain Associated symptoms: no fatigue and no fever        Prior to Admission medications   Medication Sig Start Date End Date Taking? Authorizing Provider  buPROPion (WELLBUTRIN XL) 300 MG 24 hr tablet Take 300 mg by mouth daily. Patient states she is taking 450mg     [provider]  DULoxetine  (CYMBALTA ) 30 MG capsule Take 1 capsule (30 mg total) by mouth daily. 08/12/23   Jerrell Cleatus Ned, MD  hydrOXYzine  (VISTARIL ) 25 MG capsule Take 1 capsule (25 mg total) by mouth every 8 (eight) hours as needed for anxiety. 08/12/23   Jerrell Cleatus Ned, MD  losartan -hydrochlorothiazide  (HYZAAR) 50-12.5 MG tablet Take 1 tablet by mouth daily. 08/12/23   Jerrell Cleatus Ned, MD    Allergies: Niacin and Avelox [moxifloxacin hcl in nacl]    Review of Systems  Constitutional:  Negative for chills, fatigue and fever.  Respiratory:  Negative for cough, chest tightness, shortness of breath and wheezing.   Cardiovascular:  Negative for chest pain and palpitations.  Gastrointestinal:  Negative for abdominal pain, constipation, diarrhea, nausea and vomiting.  Neurological:  Negative for dizziness, seizures, weakness, light-headedness, numbness and headaches.    Updated Vital Signs BP (!) 162/83 (BP Location: Left Arm)   Pulse 76   Temp 98.2 F (36.8 C) (Oral)   Resp 20   LMP  09/25/2013   SpO2 95%   Physical Exam Vitals and nursing note reviewed.  Constitutional:      General: She is not in acute distress.    Appearance: Normal appearance.  HENT:     Head: Normocephalic and atraumatic.   Eyes:     Conjunctiva/sclera: Conjunctivae normal.    Cardiovascular:     Rate and Rhythm: Normal rate.  Pulmonary:     Effort: Pulmonary effort is normal. No respiratory distress.   Musculoskeletal:     Cervical back: Normal range of motion and neck supple. No rigidity, bony tenderness or crepitus. No pain with movement, spinous process tenderness or muscular tenderness.     Thoracic back: No tenderness or bony tenderness. Normal range of motion.     Lumbar back: No bony tenderness. Normal range of motion.     Comments: Generalized bony tenderness to palpation to anterior right knee and right hip.  Motor 5/5 of right hip, right knee, right ankle.  No bony tenderness of right foot, right ankle, right calf, right femur.  Sensation 2/2 BLE.  DP 2+.  No overlying skin changes of ecchymosis nor signs of infection of BLE.   Skin:    Coloration: Skin is not jaundiced or pale.   Neurological:     Mental Status: She is alert. Mental status is at baseline.     (all labs ordered are listed, but only abnormal results are displayed) Labs Reviewed - No data  to display  EKG: None  Radiology: No results found.   Medications Ordered in the ED  ketorolac  (TORADOL ) 15 MG/ML injection 15 mg (has no administration in time range)                                    Medical Decision Making Amount and/or Complexity of Data Reviewed Radiology: ordered.  Risk Prescription drug management.   Patient presents to the ED for concern of right hip and knee pain for the past month, this involves an extensive number of treatment options, and is a complaint that carries with it a high risk of complications and morbidity.  The differential diagnosis includes fracture, contusion,  dislocation, OA, vascular injury, septic joint, OA   Co morbidities that complicate the patient evaluation  See HPI   Additional history obtained:  Additional history obtained from Nursing   External records from outside source obtained and reviewed including RN note     Imaging Studies ordered:  I ordered imaging studies including right hip and right knee x-rays I independently visualized and interpreted imaging which showed   No acute or healing fracture. Tricompartmental osteoarthritis. Small joint effusion I agree with the radiologist interpretation     Medicines ordered and prescription drug management:  I ordered medication including toradol , naprosyn   for pain  Reevaluation of the patient after these medicines showed that the patient improved I have reviewed the patients home medicines and have made adjustments as needed     Problem List / ED Course:  Right hip pain Right knee pain No obvious swelling nor overlying skin changes noted to right hip or knee.  Bony tenderness on exam.  Fortunately, motor and sensation intact as noted in physical exam above.   Will obtain x-rays of right hip, right knee.  Assuming that they are normal, will provide knee brace, orthopedic follow-up, analgesia Low suspicion for emergent cause of symptoms to include septic joint as there are no signs infection, erythema, warmth, gross swelling.  She is able to range her joints of right lower extremity WNL. Low suspicion for vascular or spinal injury as pulses are bilaterally palpable 2+.  Well-perfused extremity. No neuro deficits   Reevaluation:  After the interventions noted above, I reevaluated the patient and found that they have :improved   Social Determinants of Health:  Has PCP   Dispostion:  After consideration of the diagnostic results and the patients response to treatment, I feel that the patent would benefit from outpatient management with PCP follow-up,  orthopedic follow-up Discussed ED workup, disposition, return to ED precautions with patient who expresses understanding agrees with plan.  All questions answered to their satisfaction.  They are agreeable to plan.  Discharge instructions provided on paperwork  Final diagnoses:  Acute pain of right knee  Right hip pain    ED Discharge Orders     None          Minnie Tinnie BRAVO, PA 10/03/23 2144    Albertina Dixon, MD 10/04/23 [phone]

## 2023-10-03 NOTE — ED Triage Notes (Signed)
 Pt reports she fell about a month ago and injured her right leg.  Has had continued right hip and leg pain that is constant in nature.

## 2023-10-03 NOTE — Discharge Instructions (Addendum)
 Thank for letting us  evaluate you today.  Your x-ray were negative for fractures but shows arthritis in knee.  This does not rule out a ligamentous injurytho so  I provided you with orthopedic follow-up for further management and knee brace.  Please try to reduce activity on knee. You may use naproxen  and Tylenol  intermittently every 8 hours as needed for pain.  Please do not use naproxen  with aspirin, Aleve , ibuprofen , Advil  as they are all in the same family. You can start taking naprosyn  tomorrow. You can also elevate and ice or heat areas of pain. You may pick up voltaren gel from pharmacy for topical relief.  Return to Emergency Department if you experience hot warm extremity to suggest infection, significant worsening of pain, new injury, new fall

## 2024-01-05 ENCOUNTER — Encounter (HOSPITAL_COMMUNITY): Payer: Self-pay

## 2024-01-05 ENCOUNTER — Ambulatory Visit (HOSPITAL_COMMUNITY)
Admission: EM | Admit: 2024-01-05 | Discharge: 2024-01-05 | Disposition: A | Discharge status: Home / Self Care | Payer: Self-pay | Attending: Family Medicine | Admitting: Family Medicine

## 2024-01-05 DIAGNOSIS — R0981 Nasal congestion: Secondary | ICD-10-CM

## 2024-01-05 DIAGNOSIS — F32A Depression, unspecified: Secondary | ICD-10-CM

## 2024-01-05 DIAGNOSIS — M25561 Pain in right knee: Secondary | ICD-10-CM

## 2024-01-05 MED ORDER — DEXAMETHASONE SODIUM PHOSPHATE 10 MG/ML IJ SOLN
10.0000 mg | Freq: Once | INTRAMUSCULAR | Status: AC
  Administered 2024-01-05: 10 mg via INTRAMUSCULAR

## 2024-01-05 MED ORDER — DEXAMETHASONE SODIUM PHOSPHATE 10 MG/ML IJ SOLN
INTRAMUSCULAR | Status: AC
  Filled 2024-01-05: qty 1

## 2024-01-05 MED ORDER — KETOROLAC TROMETHAMINE 30 MG/ML IJ SOLN
30.0000 mg | Freq: Once | INTRAMUSCULAR | Status: AC
  Administered 2024-01-05: 30 mg via INTRAMUSCULAR

## 2024-01-05 MED ORDER — BUPROPION HCL ER (XL) 150 MG PO TB24: 150.0000 mg | ORAL_TABLET | Freq: Every day | ORAL | 1 refills | Status: DC

## 2024-01-05 MED ORDER — KETOROLAC TROMETHAMINE 30 MG/ML IJ SOLN
INTRAMUSCULAR | Status: AC
Start: 2024-01-05 — End: 2024-01-05
  Filled 2024-01-05: qty 1

## 2024-01-05 NOTE — Discharge Instructions (Signed)
 Meds ordered this encounter  Medications   dexamethasone  (DECADRON ) injection 10 mg   ketorolac  (TORADOL ) 30 MG/ML injection 30 mg   buPROPion (WELLBUTRIN XL) 150 MG 24 hr tablet    Sig: Take 1 tablet (150 mg total) by mouth daily.    Dispense:  30 tablet    Refill:  1

## 2024-01-05 NOTE — ED Triage Notes (Signed)
 Patient here today with c/o worsening right knee pain X 1 day. Patient states that she fell in a patio back in June which started the pain. Pain is radiating. Patient tried taking Naproxen  and Goody Powders with no relief. Nothing helps.   Patient also c/o nasal congestion and puffy eyes this morning. Patient used a nasal spray with some relief.   Patient also requesting to go back on Wellbutrin. Patient was recently changed to Cymbalta  but not working as well.

## 2024-01-08 NOTE — ED Provider Notes (Signed)
 [facility]   [id number] 01/05/24 Arrival Time: 1205  ASSESSMENT & PLAN:  1. Acute pain of right knee   2. Nasal congestion   3. Depression, unspecified depression type    Discussed typical duration of possible viral illness that would explain nasal congestion. Trial of decadron . This may help knee also. Started back on Wellbutrin XL at her request; discussed importance of PCP f/u.  Meds ordered this encounter  Medications   dexamethasone  (DECADRON ) injection 10 mg   ketorolac  (TORADOL ) 30 MG/ML injection 30 mg   buPROPion (WELLBUTRIN XL) 150 MG 24 hr tablet    Sig: Take 1 tablet (150 mg total) by mouth daily.    Dispense:  30 tablet    Refill:  1   OTC symptom care as needed.   Follow-up Information     Schedule an appointment as soon as possible for a visit  with [facility].   Contact information: [address] [place] [address] [phone]                Reviewed expectations re: course of current medical issues. Questions answered. Outlined signs and symptoms indicating need for more acute intervention. Understanding verbalized. After Visit Summary given.   SUBJECTIVE: History from: Patient. Kelsey Hines is a 53 y.o. female. Patient here today with c/o worsening right knee pain X 1 day. Patient states that she fell in a patio back in June which started the pain. Pain is radiating. Patient tried taking Naproxen  and Goody Powders with no relief. Nothing helps.   Patient also c/o nasal congestion and puffy eyes this morning. Patient used a nasal spray with some relief.   Patient also requesting to go back on Wellbutrin. Patient was recently changed to Cymbalta  but not working as well.  Denies: fever. Normal PO intake without n/v/d.  OBJECTIVE:  Vitals:   01/05/24 1328  BP: 126/72  Pulse: 64  Resp: 16  Temp: (!) 97.5 F (36.4 C)  TempSrc: Oral  SpO2: 98%    General appearance: alert; no distress Eyes: PERRLA;  EOMI; conjunctiva normal HENT: [place]; AT; with nasal congestion Neck: supple  Lungs: speaks full sentences without difficulty; unlabored Extremities: no edema; R knee; moving normally; mild swelling Skin: warm and dry Neurologic: normal gait Psychological: alert and cooperative; normal mood and affect  Allergies  Allergen Reactions   Niacin     Does not like the flush feeling   Avelox [Moxifloxacin Hcl In Nacl] Rash    Past Medical History:  Diagnosis Date   Ankylosis    Arthritis    Depression    Herniated disc    Hypertension    Scoliosis (and kyphoscoliosis), idiopathic    Social History   Socioeconomic History   Marital status: Legally Separated    Spouse name: Not on file   Number of children: Not on file   Years of education: Not on file   Highest education level: Not on file  Occupational History   Not on file  Tobacco Use   Smoking status: Former    Types: Cigarettes   Smokeless tobacco: Never   Tobacco comments:    quit 2016  Vaping Use   Vaping status: Every Day   Substances: Nicotine, Flavoring  Substance and Sexual Activity   Alcohol use: Yes    Comment: occationally   Drug use: No   Sexual activity: Not Currently  Other Topics Concern   Not on file  Social History Narrative   Not on  file   Social Drivers of Corporate investment banker Strain: Not on file  Food Insecurity: Not on file  Transportation Needs: Not on file  Physical Activity: Not on file  Stress: Not on file  Social Connections: Not on file  Intimate Partner Violence: Not on file   Family History  Problem Relation Age of Onset   COPD Mother    Hypertension Mother    Diabetes Father    Hypertension Father    Kidney disease Father    Past Surgical History:  Procedure Laterality Date   KNEE SURGERY     SHOULDER ARTHROSCOPY       Rolinda Rogue, MD 01/08/24 1028

## 2024-03-15 ENCOUNTER — Ambulatory Visit (HOSPITAL_COMMUNITY)
Admission: EM | Admit: 2024-03-15 | Discharge: 2024-03-15 | Disposition: A | Discharge status: Home / Self Care | Payer: Self-pay | Attending: Internal Medicine | Admitting: Internal Medicine

## 2024-03-15 ENCOUNTER — Encounter (HOSPITAL_COMMUNITY): Payer: Self-pay

## 2024-03-15 DIAGNOSIS — G8929 Other chronic pain: Secondary | ICD-10-CM

## 2024-03-15 DIAGNOSIS — M25561 Pain in right knee: Secondary | ICD-10-CM

## 2024-03-15 DIAGNOSIS — R1084 Generalized abdominal pain: Secondary | ICD-10-CM

## 2024-03-15 MED ORDER — DICYCLOMINE HCL 20 MG PO TABS: 20.0000 mg | ORAL_TABLET | Freq: Two times a day (BID) | ORAL | 0 refills | Status: AC

## 2024-03-15 MED ORDER — DEXAMETHASONE SOD PHOSPHATE PF 10 MG/ML IJ SOLN
10.0000 mg | Freq: Once | INTRAMUSCULAR | Status: AC
  Administered 2024-03-15: 10 mg via INTRAMUSCULAR

## 2024-03-15 NOTE — Discharge Instructions (Addendum)
 We gave you an injection of dexamethasone  steroid today to help with your knee pain.   Please schedule an appointment with [facility] for ongoing evaluation of your knee pain. Their phone number is listed on your paperwork.   Your abdominal pain is likely due to spasms of the muscles of your bowel walls.   Take dicyclomine (bentyl) 1 tablet every 12 hours as needed for abdominal pain.  Drink plenty of water to stay well hydrated.  Schedule a follow-up appointment with your PCP to make sure your pain is improving and to get a colonoscopy scheduled.   If you develop any new or worsening symptoms or if your symptoms do not start to improve, please return here or follow-up with your primary care provider. If your symptoms are severe, please go to the emergency room.

## 2024-03-15 NOTE — ED Triage Notes (Signed)
 Pt coming in today for stomach pain and head pain that started 2 days.  The pain radiates to pelvic region , denies pain when urinating. Has been taken naproxen  with no relief.

## 2024-03-15 NOTE — ED Provider Notes (Addendum)
 [facility]    CSN: [id number] Arrival date & time: 03/15/24  1653      History   Chief Complaint Chief Complaint  Patient presents with   Abdominal Pain    HPI Kelsey Hines is a 53 y.o. female.   Kelsey Hines is a 53 y.o. female presenting for chief complaint of bilateral lower abdominal pain that started 2 days ago.  Abdominal pain is localized to the bilateral lower abdomen and is currently a 4/10.  Pain is described as a sharp gripy pain that comes and goes. She believes the pain has improved after eliminating gluten from her diet. She also believes she may have IBS but isn't sure.  She has felt very bloated.  Last bowel movement was yesterday and was soft without blood or mucous in the stool.  She has not been constipated recently.  She denies acid reflux sensation, chest pain, shortness of breath, and upper back pain.  Denies nausea, vomiting, diarrhea, urinary symptoms, vaginal symptoms, new low back, and fever/chills. Denies recent antibiotic/steroid use. She has never had a colonoscopy in the past.  She further denies recent travel and recent intake of foods outside of normal diet/water from an unknown source.  Additionally complains of chronic right knee pain that has not changed much in the last 2 months since she was seen in October urgent care for similar pain.  She has not been able to get in with an orthopedic provider for evaluation of her chronic knee pain and is requesting a shot of steroid today.  She has been taking Goody powders and naproxen  frequently for her chronic right knee and right hip pain and believes that this is worsening her abdominal pain. She denies right calf pain, paresthesias distally to the bilateral lower extremities, and unilateral extremity weakness.    Abdominal Pain   Past Medical History:  Diagnosis Date   Ankylosis    Arthritis    Depression    Herniated disc    Hypertension    Scoliosis (and kyphoscoliosis),  idiopathic     Patient Active Problem List   Diagnosis Date Noted   Hypertension 08/12/2023   Unintended weight loss 08/12/2023   Generalized anxiety disorder 03/17/2022   Depression, major, single episode, moderate (HCC) 02/24/2022    Past Surgical History:  Procedure Laterality Date   KNEE SURGERY     SHOULDER ARTHROSCOPY      OB History     Gravida  4   Para  2   Term  2   Preterm      AB  1   Living  2      SAB  1   IAB      Ectopic      Multiple      Live Births  2            Home Medications    Prior to Admission medications   Medication Sig Start Date End Date Taking? Authorizing Provider  dicyclomine (BENTYL) 20 MG tablet Take 1 tablet (20 mg total) by mouth 2 (two) times daily. 03/15/24  Yes Enedelia Dorna HERO, FNP  buPROPion  (WELLBUTRIN  XL) 150 MG 24 hr tablet Take 1 tablet (150 mg total) by mouth daily. 01/05/24   Rolinda Rogue, MD  losartan -hydrochlorothiazide  (HYZAAR) 50-12.5 MG tablet Take 1 tablet by mouth daily. 08/12/23   Jerrell Cleatus Ned, MD  naproxen  (NAPROSYN ) 500 MG tablet Take 1 tablet (500 mg total) by mouth 2 (two) times daily. 10/03/23  Minnie Tinnie BRAVO, PA    Family History Family History  Problem Relation Age of Onset   COPD Mother    Hypertension Mother    Diabetes Father    Hypertension Father    Kidney disease Father     Social History Social History   Tobacco Use   Smoking status: Former    Types: Cigarettes   Smokeless tobacco: Never   Tobacco comments:    quit 2016  Vaping Use   Vaping status: Every Day   Substances: Nicotine, Flavoring  Substance Use Topics   Alcohol use: Not Currently    Comment: occationally   Drug use: No     Allergies   Niacin and Avelox [moxifloxacin hcl in nacl]   Review of Systems Review of Systems  Gastrointestinal:  Positive for abdominal pain.  Per HPI   Physical Exam Triage Vital Signs ED Triage Vitals  Encounter Vitals Group     BP 03/15/24 1747  137/79     Girls Systolic BP Percentile --      Girls Diastolic BP Percentile --      Boys Systolic BP Percentile --      Boys Diastolic BP Percentile --      Pulse Rate 03/15/24 1747 88     Resp --      Temp 03/15/24 1747 97.6 F (36.4 C)     Temp Source 03/15/24 1747 Oral     SpO2 03/15/24 1747 95 %     Weight --      Height --      Head Circumference --      Peak Flow --      Pain Score 03/15/24 1745 8     Pain Loc --      Pain Education --      Exclude from Growth Chart --    No data found.  Updated Vital Signs BP 137/79 (BP Location: Right Arm)   Pulse 88   Temp 97.6 F (36.4 C) (Oral)   LMP 09/25/2013   SpO2 95%   Visual Acuity Right Eye Distance:   Left Eye Distance:   Bilateral Distance:    Right Eye Near:   Left Eye Near:    Bilateral Near:     Physical Exam Vitals and nursing note reviewed.  Constitutional:      Appearance: She is not ill-appearing or toxic-appearing.  HENT:     Head: Normocephalic and atraumatic.     Right Ear: Hearing and external ear normal.     Left Ear: Hearing and external ear normal.     Nose: Nose normal.     Mouth/Throat:     Lips: Pink.     Mouth: Mucous membranes are moist.  Eyes:     General: Lids are normal. Vision grossly intact. Gaze aligned appropriately.     Extraocular Movements: Extraocular movements intact.     Conjunctiva/sclera: Conjunctivae normal.  Cardiovascular:     Rate and Rhythm: Normal rate and regular rhythm.     Heart sounds: Normal heart sounds, S1 normal and S2 normal.  Pulmonary:     Effort: Pulmonary effort is normal. No respiratory distress.     Breath sounds: Normal breath sounds and air entry.  Abdominal:     General: Abdomen is flat. Bowel sounds are normal.     Palpations: Abdomen is soft.     Tenderness: There is generalized abdominal tenderness. There is no right CVA tenderness, left CVA tenderness, guarding or rebound. Negative signs  include Murphy's sign and McBurney's sign.      Hernia: No hernia is present.  Musculoskeletal:     Cervical back: Neck supple.     Right knee: No swelling, deformity, effusion, erythema, ecchymosis, lacerations or crepitus. Normal range of motion. Tenderness present over the medial joint line, lateral joint line and patellar tendon. Normal alignment. Normal pulse.     Comments: +2 right anterior tibialis pulse. 5/5 strength against resistance to BLE. Sensation intact to distal BLE.   Skin:    General: Skin is warm and dry.     Capillary Refill: Capillary refill takes less than 2 seconds.     Findings: No rash.  Neurological:     General: No focal deficit present.     Mental Status: She is alert and oriented to person, place, and time. Mental status is at baseline.     Cranial Nerves: No dysarthria or facial asymmetry.  Psychiatric:        Mood and Affect: Mood normal.        Speech: Speech normal.        Behavior: Behavior normal.        Thought Content: Thought content normal.        Judgment: Judgment normal.      UC Treatments / Results  Labs (all labs ordered are listed, but only abnormal results are displayed) Labs Reviewed - No data to display  EKG   Radiology No results found.  Procedures Procedures (including critical care time)  Medications Ordered in UC Medications  dexamethasone  (DECADRON ) injection 10 mg (10 mg Intramuscular Given 03/15/24 1840)    Initial Impression / Assessment and Plan / UC Course  I have reviewed the triage vital signs and the nursing notes.  Pertinent labs & imaging results that were available during my care of the patient were reviewed by me and considered in my medical decision making (see chart for details).   1.  Generalized abdominal pain Unclear cause of patient's generalized abdominal pain.  I suspect she is experiencing bowel spasm and likely has irritable bowel syndrome.  No peritoneal signs on abdominal exam.  Murphy's sign and McBurney sign are negative.  No rebound or  involuntary guarding on exam.  Pain is currently mild. We will trial treatment with use of Bentyl twice daily as needed for 10 days.  She may additionally use Tylenol  as needed for pain. Advised to eat a bland diet and avoid spicy/acidic foods and fatty foods.  Follow-up with primary care provider (Dr. Jerrell).  Patient is due for a colonoscopy and this will need to be scheduled by her PCP.  2.  Chronic pain of right knee Patient mentions chronic right knee pain during discharge and requests dexamethasone  injection to treat acute inflammation. Recommend continued use of tylenol  1,000mg  every 6 hours as needed for pain and follow-up with orthopedic specialist for further evaluation. 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  There have not been any recent trauma/injuries to the right knee indicating need for repeat imaging today.  Counseled patient on potential for adverse effects with medications prescribed/recommended today, strict ER and return-to-clinic precautions discussed, patient verbalized understanding.    Final Clinical Impressions(s) / UC Diagnoses   Final diagnoses:  Generalized abdominal pain  Chronic pain of right knee     Discharge Instructions      We gave you an injection of dexamethasone  steroid today to help  with your knee pain.   Please schedule an appointment with [facility] for ongoing evaluation of your knee pain. Their phone number is listed on your paperwork.   Your abdominal pain is likely due to spasms of the muscles of your bowel walls.   Take dicyclomine (bentyl) 1 tablet every 12 hours as needed for abdominal pain.  Drink plenty of water to stay well hydrated.  Schedule a follow-up appointment with your PCP to make sure your pain is improving and to get a colonoscopy scheduled.   If you develop any new or worsening symptoms or if your symptoms do  not start to improve, please return here or follow-up with your primary care provider. If your symptoms are severe, please go to the emergency room.     ED Prescriptions     Medication Sig Dispense Auth. Provider   dicyclomine (BENTYL) 20 MG tablet Take 1 tablet (20 mg total) by mouth 2 (two) times daily. 20 tablet Enedelia Dorna HERO, FNP      PDMP not reviewed this encounter.   Enedelia Dorna HERO, FNP 03/15/24 1850    Enedelia Dorna HERO, FNP 03/15/24 1851

## 2024-03-27 ENCOUNTER — Ambulatory Visit: Payer: Self-pay

## 2024-03-27 NOTE — Telephone Encounter (Signed)
 Pt should ideally be moved to the last pt of the day (4:00) so that no other pts are potentially exposed.  She should be put into a room immediately and that room cannot be used again for pt care.

## 2024-03-27 NOTE — Telephone Encounter (Signed)
 FYI Only or Action Required?: FYI only for provider: appointment scheduled on 03/28/24.  Patient was last seen in primary care on 08/12/2023 by Jerrell Cleatus Ned, MD.  Called Nurse Triage reporting Rash.  Symptoms began a week ago.  Interventions attempted: Rest, hydration, or home remedies.  Symptoms are: gradually worsening.  Triage Disposition: Call PCP Within 24 Hours  Patient/caregiver understands and will follow disposition?: Yes    Copied from CRM #[id number]. Topic: Clinical - Red Word Triage >> Mar 27, 2024  2:35 PM Victoria A wrote: [facility] that prompted transfer to Nurse Triage: Patient was exposed to Measles-patient has rash on arms, face and legs and back. Lesions are flat are itchy     Reason for Disposition  [1] Symptoms of measles (cough or rash following measles exposure) AND [2] diagnosis has not been confirmed by a doctor (or NP/PA)  Answer Assessment - Initial Assessment Questions Pt called in to report exposure to measles at her work place d/t pt contact. Pt has been experiencing symptoms x 1 week. Pt states that she was told it was not measles at work and her pt was misdiagnosed so when she developed an itchy rash on her body, she suspected she had been biten by a flea or something from her cat. She states that rash worsened to her face and she developed a runny  nose then her pt was dx with measles. Pt immediately contacted clinic and was transferred to NT. Pt states that rash is flat macular, itchy rash; looks like snake skin. She has been applying a mixture of aloe, olive oil and witch hazel to aid with itch relief. Pt reports she did receive vaccine as a child but has never had titers. Discussed importance of isolation and masking when coming for appt. Appointment scheduled for evaluation. Patient agrees with plan of care, and will call back if anything changes, or if symptoms worsen.     1. DIAGNOSIS CONFIRMATION: Who diagnosed you with measles?  When?      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 Pt began exhibiting symptoms 1 week ago via rash and runny nose   2. APPEARANCE of RASH: What does the rash look like?      flat macular, itchy rash; looks like snake skin  3. LOCATION: Where is the rash located?      Face, arms, back and legs  4. ONSET: How long has the rash been present?      1 week ago   5. FEVER:  Do you have a fever? If Yes, ask: What is it, how was it measured, and when did it start?       Denies fever   6. OTHER SYMPTOMS: Do you have any other symptoms? (e.g., cough, runny nose, red eyes)      Runny nose   7. ONSET of SYMPTOMS: When did these other symptoms start?     1 week ago   8. PLACE of EXPOSURE:  Where were you when you were exposed to measles? (e.g., school, work, waiting room, plane, international travel)     Work; [employer] at nursing home   9. DATE of EXPOSURE: When did the exposure occur? (e.g., days ago)     Approx 2 weeks ago   10. MEASLES VACCINE: Have you received the measles vaccine?        Yes; reports vaccinated as a child   71. PRIOR MEASLES HISTORY: Have  you ever had measles before?       No   12. PRIOR BLOOD TEST FOR MEASLES: Have you ever had a blood test that showed whether or not you had immunity to measles? If Yes, ask: Did it show you were immune?         No  Protocols used: Measles - Diagnosed or Suspected-A-AH

## 2024-03-27 NOTE — Telephone Encounter (Signed)
 Patient moved to 4pm slot

## 2024-03-28 ENCOUNTER — Other Ambulatory Visit: Payer: Self-pay

## 2024-03-28 ENCOUNTER — Ambulatory Visit: Payer: Self-pay | Admitting: Family

## 2024-03-28 ENCOUNTER — Emergency Department (HOSPITAL_COMMUNITY)
Admission: EM | Admit: 2024-03-28 | Discharge: 2024-03-28 | Discharge status: Against Medical Advice | Payer: Self-pay | Attending: Emergency Medicine | Admitting: Emergency Medicine

## 2024-03-28 DIAGNOSIS — Z5321 Procedure and treatment not carried out due to patient leaving prior to being seen by health care provider: Secondary | ICD-10-CM | POA: Insufficient documentation

## 2024-03-28 DIAGNOSIS — Z20828 Contact with and (suspected) exposure to other viral communicable diseases: Secondary | ICD-10-CM | POA: Insufficient documentation

## 2024-03-28 DIAGNOSIS — R21 Rash and other nonspecific skin eruption: Secondary | ICD-10-CM | POA: Insufficient documentation

## 2024-03-28 LAB — RESP PANEL BY RT-PCR (RSV, FLU A&B, COVID)  RVPGX2
Influenza A by PCR: NEGATIVE
Influenza B by PCR: NEGATIVE
Resp Syncytial Virus by PCR: NEGATIVE
SARS Coronavirus 2 by RT PCR: NEGATIVE

## 2024-03-28 NOTE — ED Triage Notes (Signed)
 Pt arrived POV d/t being exposed to the measles rash 3 weeks ago, pt states she thought nothing of it and then started have cold like symptoms and a rash a week ago.Rash located through body. Pt rates pain 7/10.

## 2024-03-28 NOTE — ED Provider Triage Note (Signed)
 Emergency Medicine Provider Triage Evaluation Note  Kelsey Hines , a 53 y.o. female  was evaluated in triage.  Pt complains of exposure to measles at work 3 weeks ago.  Symptoms started 1 week ago.  Believes she is up-to-date on her childhood vaccinations including measles..  Review of Systems  Positive: As above Negative: As above  Physical Exam  BP (!) 160/87 (BP Location: Right Arm)   Pulse 96   Temp 97.7 F (36.5 C)   Resp 19   Ht 5' 7 (1.702 m)   Wt 63.5 kg   LMP 09/25/2013   SpO2 100%   BMI 21.93 kg/m  Gen:   Awake, no distress   Resp:  Normal effort  MSK:   Moves extremities without difficulty  Other:   Medical Decision Making  Medically screening exam initiated at 5:59 PM.  Appropriate orders placed.  Kelsey Hines was informed that the remainder of the evaluation will be completed by another provider, this initial triage assessment does not replace that evaluation, and the importance of remaining in the ED until their evaluation is complete.  Discussed with attending.  Her measles pathway includes patients at the [place] airport terminal to on December 10 from 4-8 AM.  She does not fit this criteria.  Will obtain respiratory panel to ensure is not another virus.   Hildegard Loge, PA-C 03/28/24 1800

## 2024-03-28 NOTE — ED Notes (Signed)
 Pt called *5 for vitals no response moving off the floor

## 2024-03-28 NOTE — Telephone Encounter (Signed)
 Patient is headed to ER to be seen, patient is wondering if she can get a refill on her Wellbutrin ?

## 2024-03-28 NOTE — ED Triage Notes (Signed)
 Pt states that she was exposed to the measels and would like to be evaluated as she reports that she has a rash on her leg.

## 2024-03-31 ENCOUNTER — Other Ambulatory Visit: Payer: Self-pay | Admitting: Family

## 2024-03-31 MED ORDER — BUPROPION HCL ER (XL) 150 MG PO TB24: 150.0000 mg | ORAL_TABLET | Freq: Every day | ORAL | 1 refills | Status: AC
